# Patient Record
Sex: Female | Born: 1955 | Race: White | Hispanic: No | Marital: Married | Smoking: Former smoker
Health system: Southern US, Community
[De-identification: ages and names within clinical notes are randomized; demographics above are authoritative.]

---

## 1996-12-07 DIAGNOSIS — C679 Malignant neoplasm of bladder, unspecified: Secondary | ICD-10-CM

## 1996-12-07 HISTORY — DX: Malignant neoplasm of bladder, unspecified: C67.9

## 2019-07-28 ENCOUNTER — Other Ambulatory Visit: Payer: Self-pay | Admitting: Internal Medicine

## 2019-07-28 DIAGNOSIS — Z1231 Encounter for screening mammogram for malignant neoplasm of breast: Secondary | ICD-10-CM

## 2019-09-01 ENCOUNTER — Ambulatory Visit
Admission: RE | Admit: 2019-09-01 | Discharge: 2019-09-01 | Disposition: A | Discharge status: Home / Self Care | Payer: PRIVATE HEALTH INSURANCE | Source: Ambulatory Visit | Attending: Internal Medicine | Admitting: Internal Medicine

## 2019-09-01 ENCOUNTER — Encounter: Payer: Self-pay | Admitting: Radiology

## 2019-09-01 DIAGNOSIS — Z1231 Encounter for screening mammogram for malignant neoplasm of breast: Secondary | ICD-10-CM | POA: Diagnosis present

## 2020-08-06 ENCOUNTER — Other Ambulatory Visit: Payer: Self-pay | Admitting: Internal Medicine

## 2020-08-06 DIAGNOSIS — Z1231 Encounter for screening mammogram for malignant neoplasm of breast: Secondary | ICD-10-CM

## 2020-09-06 ENCOUNTER — Other Ambulatory Visit: Payer: Self-pay

## 2020-09-06 ENCOUNTER — Ambulatory Visit
Admission: RE | Admit: 2020-09-06 | Discharge: 2020-09-06 | Disposition: A | Discharge status: Home / Self Care | Payer: PRIVATE HEALTH INSURANCE | Source: Ambulatory Visit | Attending: Internal Medicine | Admitting: Internal Medicine

## 2020-09-06 DIAGNOSIS — Z1231 Encounter for screening mammogram for malignant neoplasm of breast: Secondary | ICD-10-CM | POA: Insufficient documentation

## 2021-08-06 ENCOUNTER — Other Ambulatory Visit: Payer: Self-pay | Admitting: Internal Medicine

## 2021-08-06 DIAGNOSIS — Z1231 Encounter for screening mammogram for malignant neoplasm of breast: Secondary | ICD-10-CM

## 2021-09-11 ENCOUNTER — Other Ambulatory Visit: Payer: Self-pay

## 2021-09-11 ENCOUNTER — Ambulatory Visit
Admission: RE | Admit: 2021-09-11 | Discharge: 2021-09-11 | Disposition: A | Discharge status: Home / Self Care | Payer: Medicare Other | Source: Ambulatory Visit | Attending: Internal Medicine | Admitting: Internal Medicine

## 2021-09-11 DIAGNOSIS — Z1231 Encounter for screening mammogram for malignant neoplasm of breast: Secondary | ICD-10-CM | POA: Diagnosis not present

## 2022-04-08 IMAGING — MG MM DIGITAL SCREENING BILAT W/ TOMO AND CAD
6 of 10 series · 6 of 30 positions shown · non-contrast
Comparison: Previous exam(s).

CLINICAL DATA: Screening.

EXAM:
DIGITAL SCREENING BILATERAL MAMMOGRAM WITH TOMOSYNTHESIS AND CAD
TECHNIQUE: Bilateral screening digital craniocaudal and mediolateral oblique
mammograms were obtained. Bilateral screening digital breast
tomosynthesis was performed. The images were evaluated with
computer-aided detection.

[L MLO synth-2D (1 of 2)]
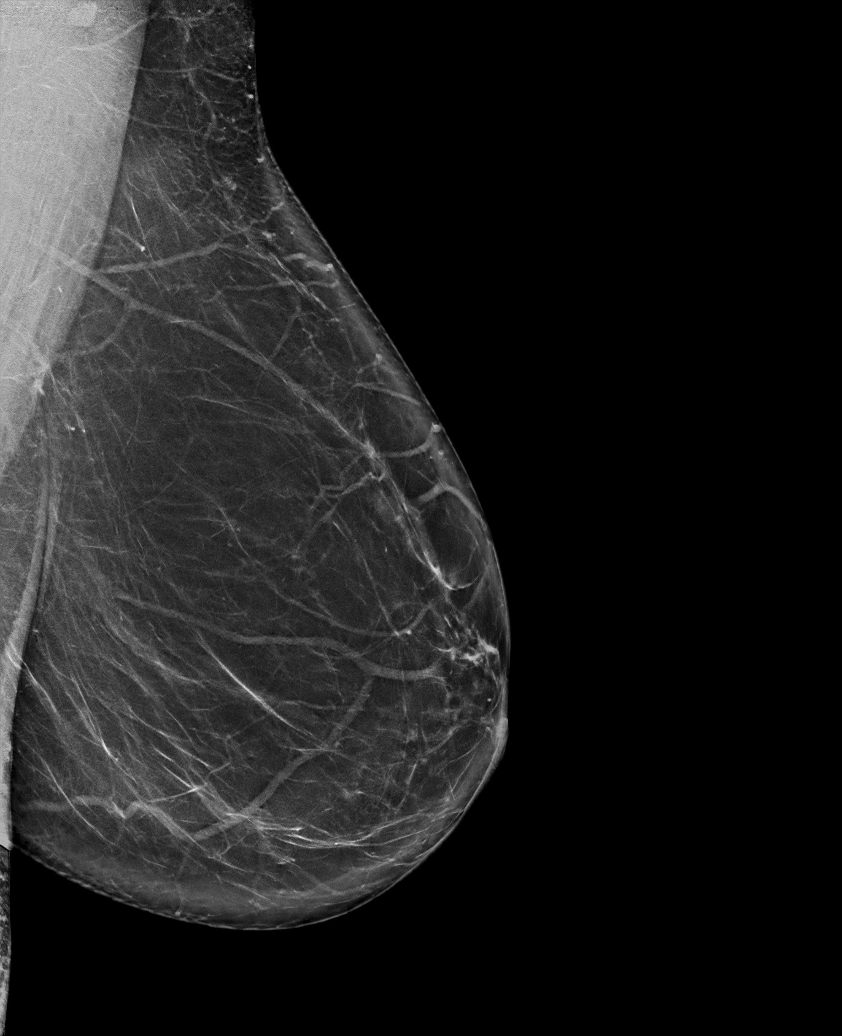

[R MLO synth-2D]
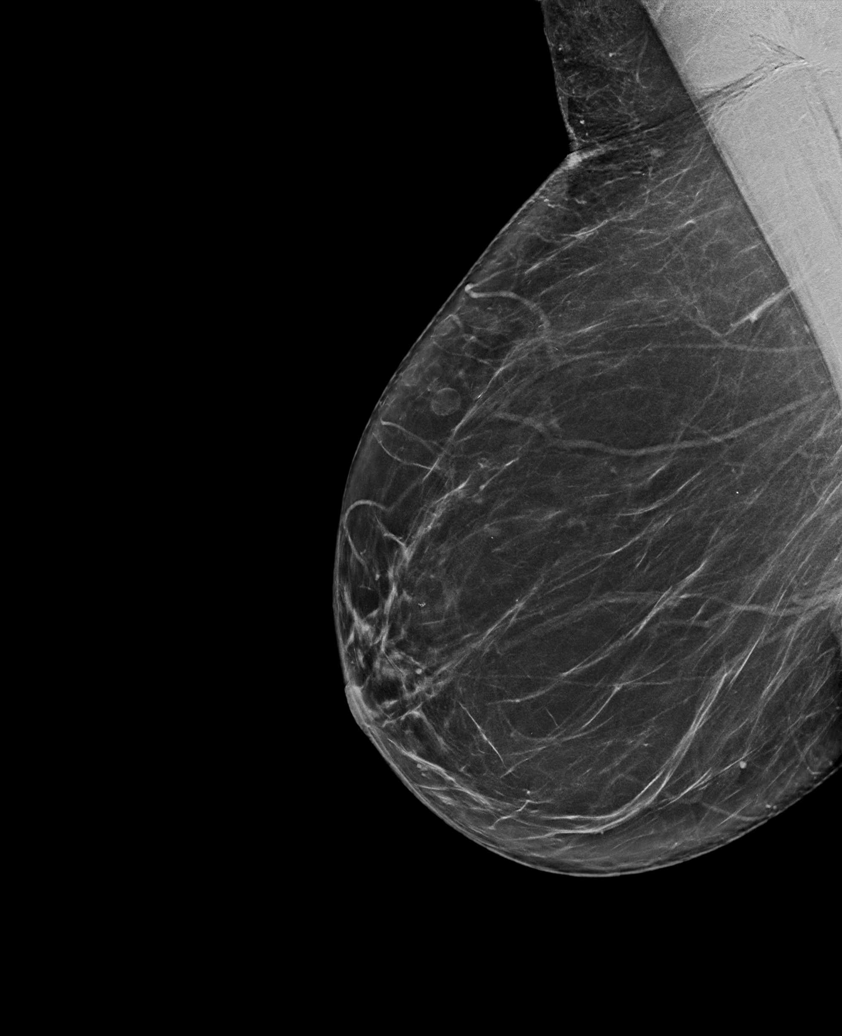

[L MLO synth-2D (2 of 2)]
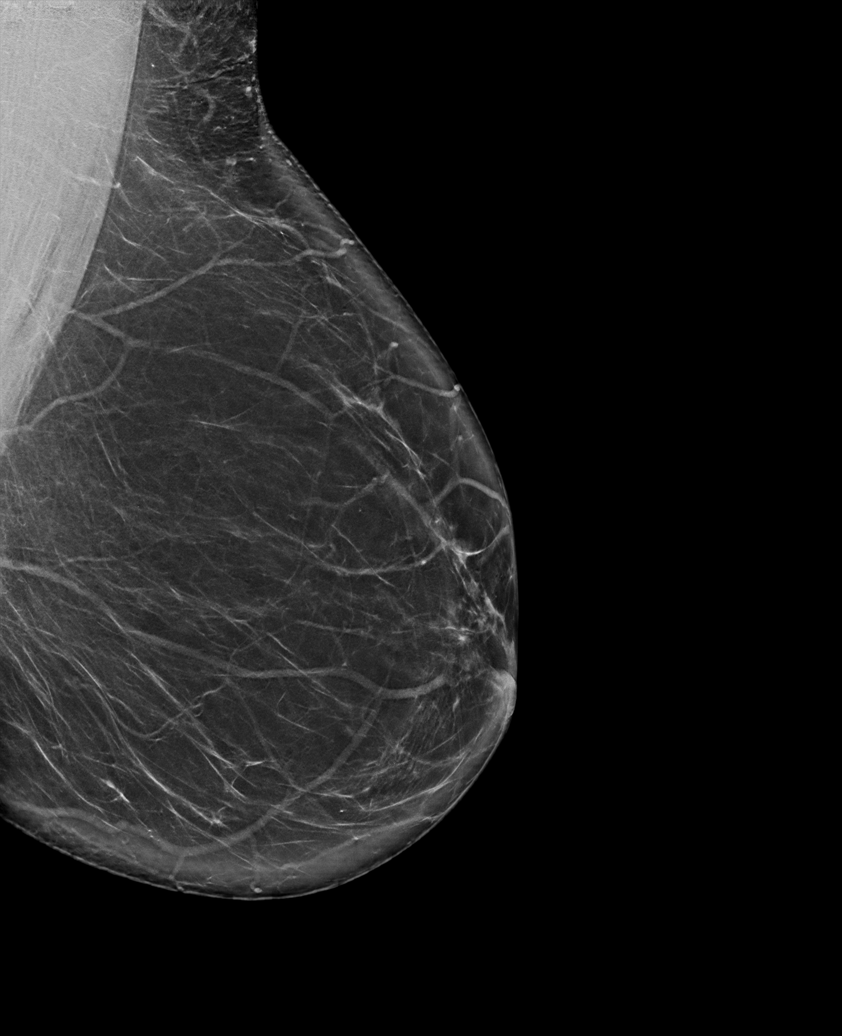

[R CC synth-2D]
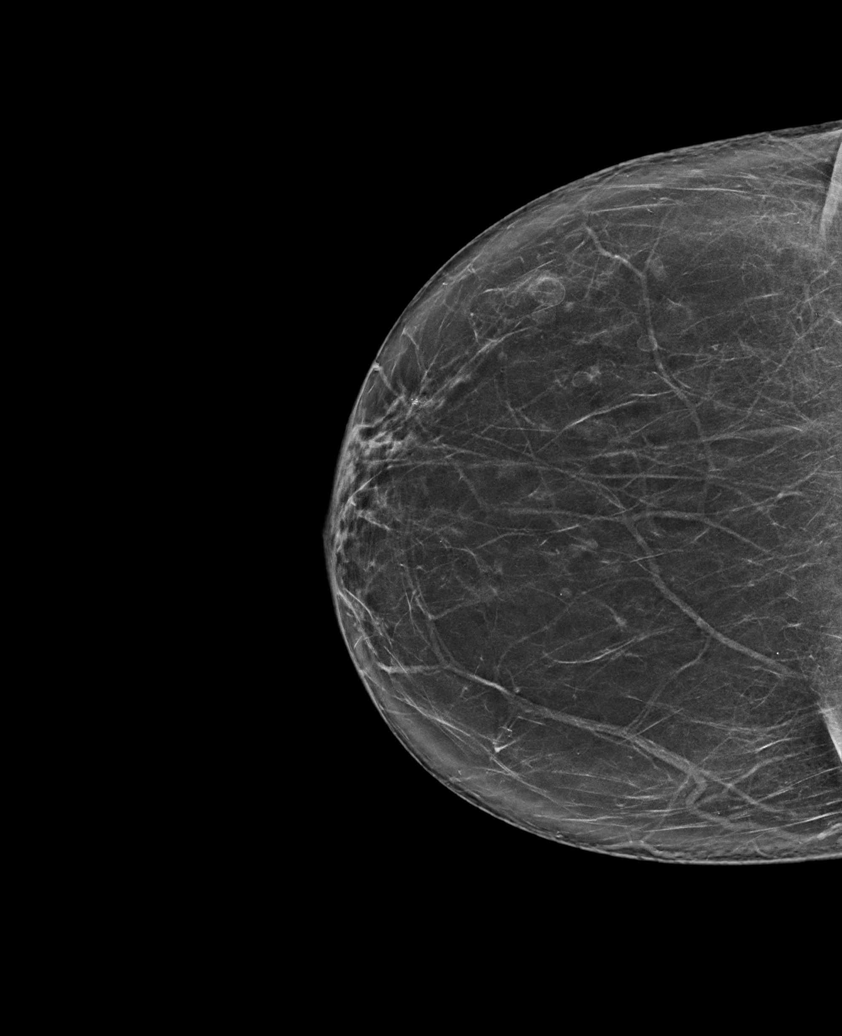

[L CC synth-2D]
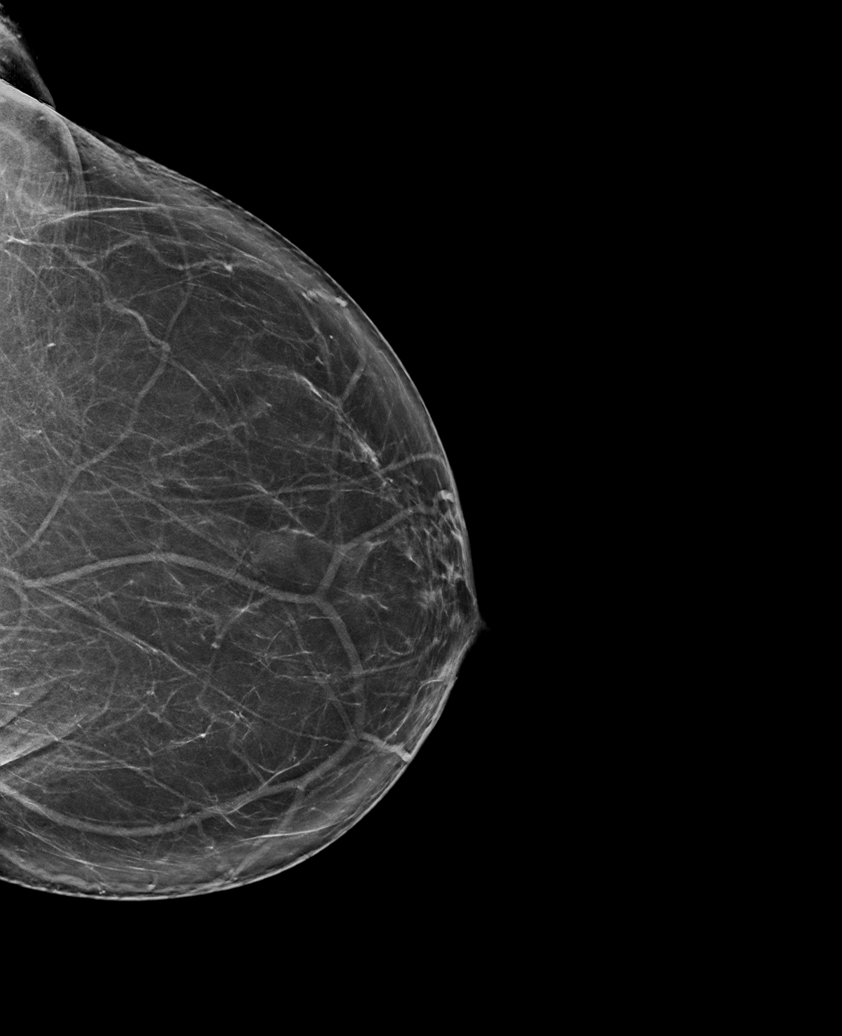

[L CC tomo · tomo slice 39/76.0]
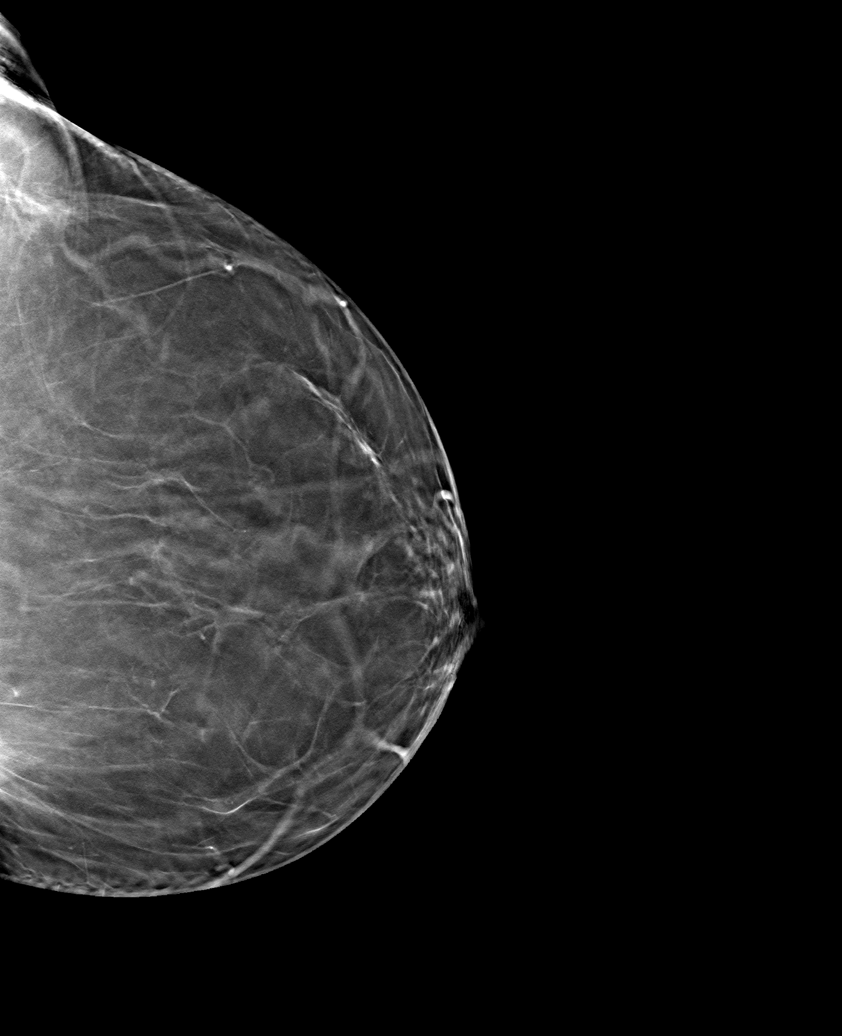

[6 of 30 positions shown; findings below may reference images not displayed]

ACR Breast Density Category b: There are scattered areas of
fibroglandular density.
FINDINGS: There are no findings suspicious for malignancy.
IMPRESSION: No mammographic evidence of malignancy. A result letter of this
screening mammogram will be mailed directly to the patient.

RECOMMENDATION:
Screening mammogram in one year. (Code:51-O-LD2)

BI-RADS CATEGORY  1: Negative.

## 2022-07-03 ENCOUNTER — Other Ambulatory Visit: Payer: Self-pay | Admitting: Internal Medicine

## 2022-07-03 DIAGNOSIS — Z1231 Encounter for screening mammogram for malignant neoplasm of breast: Secondary | ICD-10-CM

## 2022-09-15 ENCOUNTER — Ambulatory Visit
Admission: RE | Admit: 2022-09-15 | Discharge: 2022-09-15 | Disposition: A | Discharge status: Home / Self Care | Payer: Medicare Other | Source: Ambulatory Visit | Attending: Internal Medicine | Admitting: Internal Medicine

## 2022-09-15 DIAGNOSIS — Z1231 Encounter for screening mammogram for malignant neoplasm of breast: Secondary | ICD-10-CM | POA: Diagnosis present

## 2023-02-10 ENCOUNTER — Other Ambulatory Visit (HOSPITAL_BASED_OUTPATIENT_CLINIC_OR_DEPARTMENT_OTHER): Payer: Self-pay | Admitting: Registered Nurse

## 2023-02-10 DIAGNOSIS — Z8249 Family history of ischemic heart disease and other diseases of the circulatory system: Secondary | ICD-10-CM

## 2023-02-17 ENCOUNTER — Inpatient Hospital Stay: Admission: RE | Admit: 2023-02-17 | Payer: Medicare Other | Source: Ambulatory Visit

## 2023-02-18 ENCOUNTER — Ambulatory Visit: Payer: Medicare Other

## 2023-02-18 ENCOUNTER — Encounter: Payer: Self-pay | Admitting: Urology

## 2023-02-18 ENCOUNTER — Ambulatory Visit: Payer: Medicare Other | Admitting: Urology

## 2023-02-18 ENCOUNTER — Ambulatory Visit: Admission: RE | Admit: 2023-02-18 | Payer: Medicare Other | Source: Ambulatory Visit

## 2023-02-18 VITALS — BP 147/94 | HR 82 | Ht 66.0 in | Wt 193.0 lb

## 2023-02-18 DIAGNOSIS — Z8551 Personal history of malignant neoplasm of bladder: Secondary | ICD-10-CM

## 2023-02-18 LAB — URINALYSIS, COMPLETE
Bilirubin, UA: NEGATIVE
Glucose, UA: NEGATIVE
Ketones, UA: NEGATIVE
Nitrite, UA: POSITIVE — AB
Specific Gravity, UA: 1.015 (ref 1.005–1.030)
Urobilinogen, Ur: 0.2 mg/dL (ref 0.2–1.0)
pH, UA: 5.5 (ref 5.0–7.5)

## 2023-02-18 LAB — MICROSCOPIC EXAMINATION: WBC, UA: >30 /hpf — AB (ref 0–5)

## 2023-02-18 NOTE — Progress Notes (Signed)
   02/18/2023 10:03 AM   Colleen Key 1956-02-26 [id number]  Referring provider: [facility], [place] [facility] [place],  [place] [address]  Chief Complaint  Patient presents with   Other    HPI: Colleen Key is a 67 y.o. female referred for history bladder cancer.  Radical cystectomy with continent diversion by Dr. Esperanza Heir in [place] in 2006 She had no postoperative problems and it has been at least 10+ years since she had urologic follow-up She catheterizes per urethra.  No history of UTIs or pyelonephritis Denies flank, abdominal, pelvic pain No gross hematuria Creatinine 10/26/2022 was 0.8; LFTs normal   PMH: Past Medical History:  Diagnosis Date   Bladder cancer ([place]) 1998    Surgical History: No past surgical history on file.  Home Medications:  Allergies as of 02/18/2023       Reactions   Sulfa Antibiotics Swelling        Medication List        Accurate as of February 18, 2023 10:03 AM. If you have any questions, ask your nurse or doctor.          buPROPion 150 MG 24 hr tablet Commonly known as: WELLBUTRIN XL 1 tablet in the morning Orally Once a day   cetirizine 10 MG tablet Commonly known as: ZYRTEC 1 tablet Orally Once a day   Cholecalciferol 50 MCG (2000 UT) Tabs Take by mouth.   escitalopram 20 MG tablet Commonly known as: LEXAPRO 1 tablet Orally Once a day   Magnesium 250 MG Tabs 1 tablet with a meal Orally Once a day        Allergies:  Allergies  Allergen Reactions   Sulfa Antibiotics Swelling    Family History: Family History  Problem Relation Age of Onset   Breast cancer Neg Hx     Social History:  has no history on file for tobacco use, alcohol use, and drug use.   Physical Exam: BP (!) 147/94   Pulse 82   Ht 5\' 6"  (1.676 m)   Wt 193 lb (87.5 kg)   BMI 31.15 kg/m   Constitutional:  Alert and oriented, No acute distress. HEENT: [place] AT Respiratory: Normal respiratory effort, no  increased work of breathing. Psychiatric: Normal mood and affect.  Laboratory Data:  Urinalysis Dipstick trace blood/nitrite positive/1+ leukocytes Microscopy >30 WBC/3-10 RBC/many bacteria   Assessment & Plan:    1.  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

## 2023-02-23 LAB — CULTURE, URINE COMPREHENSIVE

## 2023-02-25 ENCOUNTER — Ambulatory Visit
Admission: RE | Admit: 2023-02-25 | Discharge: 2023-02-25 | Disposition: A | Discharge status: Home / Self Care | Payer: Medicare Other | Source: Ambulatory Visit | Attending: Registered Nurse | Admitting: Registered Nurse

## 2023-02-25 DIAGNOSIS — Z8249 Family history of ischemic heart disease and other diseases of the circulatory system: Secondary | ICD-10-CM

## 2023-03-17 ENCOUNTER — Ambulatory Visit: Payer: Medicare Other | Attending: Internal Medicine | Admitting: Internal Medicine

## 2023-03-17 VITALS — BP 138/80 | HR 61 | Ht 66.0 in | Wt 193.4 lb

## 2023-03-17 DIAGNOSIS — R931 Abnormal findings on diagnostic imaging of heart and coronary circulation: Secondary | ICD-10-CM

## 2023-03-17 NOTE — Progress Notes (Signed)
Cardiology Office Note:    Date:  03/17/2023   ID:  Colleen Key, DOB August 08, 1956, MRN [id number]  PCP:  Fatima Sanger, FNP   [facility] Providers Cardiologist:  Maisie Fus, MD     Referring MD: Fatima Sanger, FNP   No chief complaint on file. Elevated CAC  History of Present Illness:    Colleen Key is a 67 y.o. female with a hx of bladder CA with radical cystectomy (she self caths), TAH BSO, former smoker quit 2000, referral from [facility] for elevated CAC score  She is doing well today. She started crestor and aspirin already. No issues. She likes to walk. 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 No CP with activity or SOB. No hx of cardiac stress test. No imaging. She smoked since age 82 to year 67. No claudication.  02/03/2023 TC 218 HDL 52 LDL 147 Crt 0.78  CAC 03/01/2023 Agaston score 199; 87th percentile  Past Medical History:  Diagnosis Date   Bladder cancer (HCC) 1998    No past surgical history on file.  Current Medications: Current Outpatient Medications on File Prior to Visit  Medication Sig Dispense Refill   aspirin EC 81 MG tablet Take 81 mg by mouth daily. Swallow whole.     buPROPion (WELLBUTRIN XL) 150 MG 24 hr tablet 1 tablet in the morning Orally Once a day     cetirizine (ZYRTEC) 10 MG tablet 1 tablet Orally Once a day     Cholecalciferol 50 MCG (2000 UT) TABS Take by mouth.     escitalopram (LEXAPRO) 20 MG tablet 1 tablet Orally Once a day     Magnesium 250 MG TABS 1 tablet with a meal Orally Once a day     rosuvastatin (CRESTOR) 5 MG tablet Take 5 mg by mouth daily.     No current facility-administered medications on file prior to visit.     Allergies:   Sulfa antibiotics   Social History   Socioeconomic History   Marital status: Unknown    Spouse name: Not on file   Number of children: Not on file   Years of education: Not on file   Highest education  level: Not on file  Occupational History   Not on file  Tobacco Use   Smoking status: Not on file   Smokeless tobacco: Not on file  Substance and Sexual Activity   Alcohol use: Not on file   Drug use: Not on file   Sexual activity: Not on file  Other Topics Concern   Not on file  Social History Narrative   Not on file   Social Determinants of Health   Financial Resource Strain: Not on file  Food Insecurity: Not on file  Transportation Needs: Not on file  Physical Activity: Not on file  Stress: Not on file  Social Connections: Not on file     Family History: The patient's family history is negative for Breast cancer. Mother had MI at 1.   ROS:   Please see the history of present illness.     All other systems reviewed and are negative.  EKGs/Labs/Other Studies Reviewed:    The following studies were reviewed today:   EKG:  EKG is  ordered today.  The ekg ordered today demonstrates   03/17/2023- NSR  Recent Labs: No results found for requested labs within last 365 days.   Recent Lipid Panel No results  found for: "CHOL", "TRIG", "HDL", "CHOLHDL", "VLDL", "LDLCALC", "LDLDIRECT"   Risk Assessment/Calculations:         Physical Exam:    VS:   Vitals:   03/17/23 1103  BP: 138/80  Pulse: 61  SpO2: 97%    Wt Readings from Last 3 Encounters:  03/17/23 193 lb 6.4 oz (87.7 kg)  02/18/23 193 lb (87.5 kg)     GEN:  Well nourished, well developed in no acute distress HEENT: Normal NECK: No JVD; No carotid bruits LYMPHATICS: No lymphadenopathy CARDIAC: RRR, no murmurs, rubs, gallops RESPIRATORY:  Clear to auscultation without rales, wheezing or rhonchi  ABDOMEN: Soft, non-tender, non-distended MUSCULOSKELETAL:  No edema; No deformity  SKIN: Warm and dry NEUROLOGIC:  Alert and oriented x 3 PSYCHIATRIC:  Normal affect   ASSESSMENT:    Elevated CAC: Elevated CAC: > 75th percentile.  His CAC score is elevated. Recommend statin therapy and asa 81 mg daily.  LDL goal < 70 mg/dL. Recommend continued lifestyle modifications including healthy diet, lipid management, DM2 screening, and exercise. Blood pressure is in good control - continue crestor 5 mg daily; will FU lipids - continue asa 81 mg daily  PLAN:    In order of problems listed above:  Fasting Lipids in 2 months Follow up 6 months     Medication Adjustments/Labs and Tests Ordered: Current medicines are reviewed at length with the patient today.  Concerns regarding medicines are outlined above.  Orders Placed This Encounter  Procedures   Lipid panel   EKG 12-Lead   No orders of the defined types were placed in this encounter.   Patient Instructions  Medication Instructions:  No Changes In Medications at this time.  *If you need a refill on your cardiac medications before your next appointment, please call your pharmacy*  Lab Work:  Please return for FASTING Blood Work in 2 MONTHS. No appointment needed, lab here at the office is open Monday-Friday from 8AM to 4PM and closed daily for lunch from 12:45-1:45.   If you have labs (blood work) drawn today and your tests are completely normal, you will receive your results only by: MyChart Message (if you have MyChart) OR A paper copy in the mail If you have any lab test that is abnormal or we need to change your treatment, we will call you to review the results.  Testing/Procedures: None Ordered At This Time.   Follow-Up: At [facility], you and your health needs are our priority.  As part of our continuing mission to provide you with exceptional heart care, we have created designated Provider Care Teams.  These Care Teams include your primary Cardiologist (physician) and Advanced Practice Providers (APPs -  Physician Assistants and Nurse Practitioners) who all work together to provide you with the care you need, when you need it.  Your next appointment:   6 month(s)  Provider:   Maisie Fus, MD       Signed, Maisie Fus, MD  03/17/2023 3:43 PM    [facility]

## 2023-03-17 NOTE — Patient Instructions (Signed)
Medication Instructions:  No Changes In Medications at this time.  *If you need a refill on your cardiac medications before your next appointment, please call your pharmacy*  Lab Work:  Please return for FASTING Blood Work in 2 MONTHS. No appointment needed, lab here at the office is open Monday-Friday from 8AM to 4PM and closed daily for lunch from 12:45-1:45.   If you have labs (blood work) drawn today and your tests are completely normal, you will receive your results only by: MyChart Message (if you have MyChart) OR A paper copy in the mail If you have any lab test that is abnormal or we need to change your treatment, we will call you to review the results.  Testing/Procedures: None Ordered At This Time.   Follow-Up: At [facility], you and your health needs are our priority.  As part of our continuing mission to provide you with exceptional heart care, we have created designated Provider Care Teams.  These Care Teams include your primary Cardiologist (physician) and Advanced Practice Providers (APPs -  Physician Assistants and Nurse Practitioners) who all work together to provide you with the care you need, when you need it.  Your next appointment:   6 month(s)  Provider:   Maisie Fus, MD

## 2023-04-29 ENCOUNTER — Other Ambulatory Visit: Payer: Self-pay

## 2023-04-29 DIAGNOSIS — R931 Abnormal findings on diagnostic imaging of heart and coronary circulation: Secondary | ICD-10-CM

## 2023-04-30 ENCOUNTER — Telehealth: Payer: Self-pay | Admitting: Internal Medicine

## 2023-04-30 LAB — LIPID PANEL
Chol/HDL Ratio: 2.4 ratio (ref 0.0–4.4)
Cholesterol, Total: 134 mg/dL (ref 100–199)
HDL: 57 mg/dL (ref >39)
LDL Chol Calc (NIH): 61 mg/dL (ref 0–99)
Triglycerides: 85 mg/dL (ref 0–149)
VLDL Cholesterol Cal: 16 mg/dL (ref 5–40)

## 2023-04-30 NOTE — Telephone Encounter (Signed)
Lipid panel not reviewed yet by Dr. Wyline Mood- will send once reviewed.

## 2023-04-30 NOTE — Telephone Encounter (Signed)
Patient wants copy of recent lab results mailed to her at [address], [facility] [place] [address].

## 2023-05-04 NOTE — Telephone Encounter (Signed)
Letter of results printed and mailed to patient.

## 2023-08-13 ENCOUNTER — Other Ambulatory Visit: Payer: Self-pay | Admitting: Registered Nurse

## 2023-08-13 DIAGNOSIS — Z1231 Encounter for screening mammogram for malignant neoplasm of breast: Secondary | ICD-10-CM

## 2023-09-09 ENCOUNTER — Encounter: Payer: Self-pay | Admitting: Internal Medicine

## 2023-09-09 ENCOUNTER — Ambulatory Visit: Payer: Medicare Other | Attending: Internal Medicine | Admitting: Internal Medicine

## 2023-09-09 VITALS — BP 132/80 | HR 67 | Ht 66.0 in | Wt 190.8 lb

## 2023-09-09 DIAGNOSIS — R931 Abnormal findings on diagnostic imaging of heart and coronary circulation: Secondary | ICD-10-CM

## 2023-09-09 NOTE — Progress Notes (Signed)
Cardiology Office Note:    Date:  09/09/2023   ID:  Colleen Key, DOB 1956/03/01, MRN [id number]  PCP:  Fatima Sanger, FNP   [facility] Providers Cardiologist:  Maisie Fus, MD     Referring MD: Fatima Sanger, FNP   No chief complaint on file. Elevated CAC  History of Present Illness:    Colleen Key is a 67 y.o. female with a hx of bladder CA with radical cystectomy (she self caths), TAH BSO, former smoker quit 2000, referral from [facility] for elevated CAC score  She is doing well today. She started crestor and aspirin already. No issues. She likes to walk. 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 No CP with activity or SOB. No hx of cardiac stress test. No imaging. She smoked since age 89 to year 25. No claudication.  02/03/2023 TC 218 HDL 52 LDL 147 Crt 0.78  CAC 03/01/2023 Agaston score 199; 87th percentile  Interval hx 09/09/2023 She denies CP or SOB. She is doing well.  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 This is stressful  Past Medical History:  Diagnosis Date   Bladder cancer (HCC) 1998    No past surgical history on file.  Current Medications: Current Outpatient Medications on File Prior to Visit  Medication Sig Dispense Refill   aspirin EC 81 MG tablet Take 81 mg by mouth daily. Swallow whole.     buPROPion (WELLBUTRIN XL) 150 MG 24 hr tablet 1 tablet in the morning Orally Once a day     cetirizine (ZYRTEC) 10 MG tablet 1 tablet Orally Once a day     Cholecalciferol 50 MCG (2000 UT) TABS Take by mouth.     escitalopram (LEXAPRO) 20 MG tablet 1 tablet Orally Once a day     Magnesium 250 MG TABS 1 tablet with a meal Orally Once a day     rosuvastatin (CRESTOR) 5 MG tablet Take 5 mg by mouth daily.     No current facility-administered medications on file prior to visit.     Allergies:   Sulfa antibiotics   Social History   Socioeconomic History   Marital status:  Unknown    Spouse name: Not on file   Number of children: Not on file   Years of education: Not on file   Highest education level: Not on file  Occupational History   Not on file  Tobacco Use   Smoking status: Not on file   Smokeless tobacco: Not on file  Substance and Sexual Activity   Alcohol use: Not on file   Drug use: Not on file   Sexual activity: Not on file  Other Topics Concern   Not on file  Social History Narrative   Not on file   Social Determinants of Health   Financial Resource Strain: Not on file  Food Insecurity: Not on file  Transportation Needs: Not on file  Physical Activity: Not on file  Stress: Not on file  Social Connections: Not on file     Family History: The patient's family history is negative for Breast cancer. Mother had MI at 58.   ROS:   Please see the history of present illness.     All other systems reviewed and are negative.  EKGs/Labs/Other Studies Reviewed:    The following studies were reviewed today:   EKG:  EKG is  ordered today.  The ekg ordered today demonstrates  03/17/2023- NSR  EKG Interpretation Date/Time:  Thursday September 09 2023 10:12:01 EDT Ventricular Rate:  67 PR Interval:  174 QRS Duration:  76 QT Interval:  396 QTC Calculation: 418 R Axis:   7  Text Interpretation: Normal sinus rhythm Normal ECG No previous ECGs available Confirmed by Carolan Clines ([id number]) on 09/09/2023 10:44:49 AM   Recent Labs: No results found for requested labs within last 365 days.   Recent Lipid Panel    Component Value Date/Time   CHOL 134 04/29/2023 0929   TRIG 85 04/29/2023 0929   HDL 57 04/29/2023 0929   CHOLHDL 2.4 04/29/2023 0929   LDLCALC 61 04/29/2023 0929     Risk Assessment/Calculations:         Physical Exam:    Vitals:   09/09/23 1006  BP: 132/80  Pulse: 67  SpO2: 95%     Wt Readings from Last 3 Encounters:  03/17/23 193 lb 6.4 oz (87.7 kg)  02/18/23 193 lb (87.5 kg)     GEN:  Well nourished,  well developed in no acute distress HEENT: Normal NECK: No JVD; No carotid bruits LYMPHATICS: No lymphadenopathy CARDIAC: RRR, no murmurs, rubs, gallops RESPIRATORY:  Clear to auscultation without rales, wheezing or rhonchi  ABDOMEN: Soft, non-tender, non-distended MUSCULOSKELETAL:  No edema; No deformity  SKIN: Warm and dry NEUROLOGIC:  Alert and oriented x 3 PSYCHIATRIC:  Normal affect   ASSESSMENT:    Elevated CAC: Elevated CAC: > 75th percentile.  His CAC score is elevated. Recommend statin therapy and asa 81 mg daily. LDL goal < 70 mg/dL. Recommend continued lifestyle modifications including healthy diet, lipid management, DM2 screening, and exercise. Blood pressure is in good control - continue crestor 5 mg daily, LDL is at goal 61 mg/dL - continue asa 81 mg daily  PLAN:    In order of problems listed above:   Follow up 6 months     Medication Adjustments/Labs and Tests Ordered: Current medicines are reviewed at length with the patient today.  Concerns regarding medicines are outlined above.  No orders of the defined types were placed in this encounter.  No orders of the defined types were placed in this encounter.   There are no Patient Instructions on file for this visit.   Signed, Maisie Fus, MD  09/09/2023 9:03 AM    [facility]

## 2023-09-09 NOTE — Patient Instructions (Signed)
    Follow-Up: At [facility], you and your health needs are our priority.  As part of our continuing mission to provide you with exceptional heart care, we have created designated Provider Care Teams.  These Care Teams include your primary Cardiologist (physician) and Advanced Practice Providers (APPs -  Physician Assistants and Nurse Practitioners) who all work together to provide you with the care you need, when you need it.  We recommend signing up for the patient portal called "MyChart".  Sign up information is provided on this After Visit Summary.  MyChart is used to connect with patients for Virtual Visits (Telemedicine).  Patients are able to view lab/test results, encounter notes, upcoming appointments, etc.  Non-urgent messages can be sent to your provider as well.   To learn more about what you can do with MyChart, go to [email or web address].    Your next appointment:   6 month(s)  The format for your next appointment:   In Person  Provider:   Maisie Fus, MD

## 2023-09-23 ENCOUNTER — Ambulatory Visit
Admission: RE | Admit: 2023-09-23 | Discharge: 2023-09-23 | Disposition: A | Discharge status: Home / Self Care | Payer: Medicare Other | Source: Ambulatory Visit | Attending: Registered Nurse | Admitting: Registered Nurse

## 2023-09-23 DIAGNOSIS — Z1231 Encounter for screening mammogram for malignant neoplasm of breast: Secondary | ICD-10-CM | POA: Insufficient documentation

## 2023-10-22 LAB — LAB REPORT - SCANNED: EGFR: 84

## 2023-12-16 ENCOUNTER — Ambulatory Visit: Payer: Medicare Other | Admitting: Urology

## 2023-12-16 DIAGNOSIS — H43393 Other vitreous opacities, bilateral: Secondary | ICD-10-CM | POA: Diagnosis not present

## 2023-12-16 DIAGNOSIS — H04123 Dry eye syndrome of bilateral lacrimal glands: Secondary | ICD-10-CM | POA: Diagnosis not present

## 2023-12-16 DIAGNOSIS — H53143 Visual discomfort, bilateral: Secondary | ICD-10-CM | POA: Diagnosis not present

## 2023-12-16 DIAGNOSIS — H50111 Monocular exotropia, right eye: Secondary | ICD-10-CM | POA: Diagnosis not present

## 2023-12-23 ENCOUNTER — Ambulatory Visit: Payer: Medicare Other | Admitting: Urology

## 2023-12-23 ENCOUNTER — Encounter: Payer: Self-pay | Admitting: Urology

## 2023-12-23 VITALS — BP 136/72 | HR 72 | Ht 66.0 in | Wt 193.0 lb

## 2023-12-23 DIAGNOSIS — D225 Melanocytic nevi of trunk: Secondary | ICD-10-CM | POA: Diagnosis not present

## 2023-12-23 DIAGNOSIS — Z8551 Personal history of malignant neoplasm of bladder: Secondary | ICD-10-CM

## 2023-12-23 DIAGNOSIS — R3129 Other microscopic hematuria: Secondary | ICD-10-CM

## 2023-12-23 DIAGNOSIS — D235 Other benign neoplasm of skin of trunk: Secondary | ICD-10-CM | POA: Diagnosis not present

## 2023-12-23 DIAGNOSIS — L820 Inflamed seborrheic keratosis: Secondary | ICD-10-CM | POA: Diagnosis not present

## 2023-12-23 DIAGNOSIS — D485 Neoplasm of uncertain behavior of skin: Secondary | ICD-10-CM | POA: Diagnosis not present

## 2023-12-23 NOTE — Progress Notes (Signed)
I, Maysun Anabel Bene, acting as a scribe for Riki Altes, MD., have documented all relevant documentation on the behalf of Riki Altes, MD, as directed by Riki Altes, MD while in the presence of Riki Altes, MD.  12/23/2023 3:14 PM   Sammuel Hines [birth year] [id number]  Referring provider: Fatima Sanger, FNP [address] [place],  [place] [address]  Chief Complaint  Patient presents with   Other   Urologic history 1. History of muscle invasive urothelial carcinoma of bladder Radical cystectomy with continent diversion 2006.  HPI: Colleen Key is a 68 y.o. female presents for follow-up visit.   Initially seen 02/18/23 and was doing well and annual comprehensive metabolic panel/CBC recommended to be performed annually by a primary care provider and to return here as needed. She has had no problems with catheterization. She has recently began an exercise program and has occasional mild pelvic discomfort. Labs performed 10/31/23 were reviewed. CBC and comprehensive metabolic panel was normal. The UA showed 1+ blood on dipstick and no RBCs on microscopy.    PMH: Past Medical History:  Diagnosis Date   Bladder cancer (HCC) 1998    Home Medications:  Allergies as of 12/23/2023       Reactions   Sulfa Antibiotics Swelling        Medication List        Accurate as of December 23, 2023  3:14 PM. If you have any questions, ask your nurse or doctor.          aspirin EC 81 MG tablet Take 81 mg by mouth daily. Swallow whole.   buPROPion 150 MG 24 hr tablet Commonly known as: WELLBUTRIN XL 1 tablet in the morning Orally Once a day   cetirizine 10 MG tablet Commonly known as: ZYRTEC 1 tablet Orally Once a day   Cholecalciferol 50 MCG (2000 UT) Tabs Take by mouth.   escitalopram 20 MG tablet Commonly known as: LEXAPRO 1 tablet Orally Once a day   Magnesium 250 MG Tabs 1 tablet with a meal Orally Once a day   rosuvastatin 5 MG  tablet Commonly known as: CRESTOR Take 5 mg by mouth daily.        Allergies:  Allergies  Allergen Reactions   Sulfa Antibiotics Swelling    Family History: Family History  Problem Relation Age of Onset   Breast cancer Neg Hx     Social History:  reports that she has quit smoking. Her smoking use included cigarettes. She has never used smokeless tobacco. No history on file for alcohol use and drug use.   Physical Exam: BP 136/72   Pulse 72   Ht 5\' 6"  (1.676 m)   Wt 193 lb (87.5 kg)   BMI 31.15 kg/m   Constitutional:  Alert and oriented, No acute distress. HEENT: [place] AT Respiratory: Normal respiratory effort, no increased work of breathing. Psychiatric: Normal mood and affect.   Assessment & Plan:    We discussed dipstick hematuria is highly unreliable and evaluation for abnormal amounts of blood in the urine is based on the microscopic examination  Since she catheterizes would expect to see small amounts of blood in the urine  She would like to continue annual follow-up here.   I have reviewed the above documentation for accuracy and completeness, and I agree with the above.   Riki Altes, MD  [facility] [address], [address] [place], [place] [address] [phone]

## 2024-01-13 ENCOUNTER — Ambulatory Visit: Payer: Medicare Other | Admitting: Adult Health

## 2024-01-13 ENCOUNTER — Encounter: Payer: Self-pay | Admitting: Adult Health

## 2024-01-13 VITALS — BP 145/86 | HR 70 | Ht 67.0 in | Wt 196.0 lb

## 2024-01-13 DIAGNOSIS — G4700 Insomnia, unspecified: Secondary | ICD-10-CM | POA: Diagnosis not present

## 2024-01-13 DIAGNOSIS — F4310 Post-traumatic stress disorder, unspecified: Secondary | ICD-10-CM | POA: Diagnosis not present

## 2024-01-13 DIAGNOSIS — F331 Major depressive disorder, recurrent, moderate: Secondary | ICD-10-CM

## 2024-01-13 DIAGNOSIS — F410 Panic disorder [episodic paroxysmal anxiety] without agoraphobia: Secondary | ICD-10-CM

## 2024-01-13 MED ORDER — ESCITALOPRAM OXALATE 20 MG PO TABS: 20.0000 mg | ORAL_TABLET | Freq: Every day | ORAL | 2 refills | Status: DC

## 2024-01-13 MED ORDER — ALPRAZOLAM 0.5 MG PO TABS: ORAL_TABLET | ORAL | 2 refills | Status: DC

## 2024-01-13 MED ORDER — BUPROPION HCL ER (XL) 300 MG PO TB24: 300.0000 mg | ORAL_TABLET | Freq: Every day | ORAL | 2 refills | Status: DC

## 2024-01-13 MED ORDER — ARIPIPRAZOLE 2 MG PO TABS: 2.0000 mg | ORAL_TABLET | Freq: Every day | ORAL | 2 refills | Status: DC
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
## 2024-01-27 ENCOUNTER — Ambulatory Visit: Payer: Medicare Other | Admitting: Physician Assistant

## 2024-02-10 ENCOUNTER — Ambulatory Visit: Payer: Medicare Other | Admitting: Orthopedic Surgery

## 2024-02-11 ENCOUNTER — Encounter: Payer: Self-pay | Admitting: Physician Assistant

## 2024-02-11 ENCOUNTER — Ambulatory Visit: Admitting: Physician Assistant

## 2024-02-11 ENCOUNTER — Other Ambulatory Visit (INDEPENDENT_AMBULATORY_CARE_PROVIDER_SITE_OTHER): Payer: Self-pay

## 2024-02-11 DIAGNOSIS — M542 Cervicalgia: Secondary | ICD-10-CM

## 2024-02-11 MED ORDER — METHYLPREDNISOLONE 4 MG PO TBPK: ORAL_TABLET | ORAL | 0 refills | Status: DC
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
## 2024-02-17 ENCOUNTER — Telehealth (INDEPENDENT_AMBULATORY_CARE_PROVIDER_SITE_OTHER): Payer: Medicare Other | Admitting: Adult Health

## 2024-02-17 ENCOUNTER — Encounter: Payer: Self-pay | Admitting: Adult Health

## 2024-02-17 DIAGNOSIS — F4310 Post-traumatic stress disorder, unspecified: Secondary | ICD-10-CM

## 2024-02-17 DIAGNOSIS — F410 Panic disorder [episodic paroxysmal anxiety] without agoraphobia: Secondary | ICD-10-CM

## 2024-02-17 DIAGNOSIS — F331 Major depressive disorder, recurrent, moderate: Secondary | ICD-10-CM | POA: Diagnosis not present

## 2024-02-17 DIAGNOSIS — G4700 Insomnia, unspecified: Secondary | ICD-10-CM | POA: Diagnosis not present

## 2024-02-17 MED ORDER — ARIPIPRAZOLE 2 MG PO TABS: 2.0000 mg | ORAL_TABLET | Freq: Two times a day (BID) | ORAL | 2 refills | Status: DC
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
## 2024-02-24 ENCOUNTER — Ambulatory Visit: Payer: Medicare Other | Attending: Internal Medicine | Admitting: Internal Medicine

## 2024-02-24 ENCOUNTER — Ambulatory Visit: Admitting: Rehabilitative and Restorative Service Providers"

## 2024-02-24 ENCOUNTER — Encounter: Payer: Self-pay | Admitting: Rehabilitative and Restorative Service Providers"

## 2024-02-24 ENCOUNTER — Encounter: Payer: Self-pay | Admitting: Internal Medicine

## 2024-02-24 VITALS — BP 134/72 | HR 78 | Ht 66.0 in | Wt 191.0 lb

## 2024-02-24 DIAGNOSIS — M5412 Radiculopathy, cervical region: Secondary | ICD-10-CM | POA: Diagnosis not present

## 2024-02-24 DIAGNOSIS — R293 Abnormal posture: Secondary | ICD-10-CM

## 2024-02-24 DIAGNOSIS — R931 Abnormal findings on diagnostic imaging of heart and coronary circulation: Secondary | ICD-10-CM | POA: Diagnosis not present

## 2024-02-24 DIAGNOSIS — M6281 Muscle weakness (generalized): Secondary | ICD-10-CM | POA: Diagnosis not present

## 2024-02-24 MED ORDER — CHLORTHALIDONE 25 MG PO TABS: 25.0000 mg | ORAL_TABLET | Freq: Every day | ORAL | 3 refills | Status: DC

## 2024-02-24 NOTE — Addendum Note (Signed)
 Addended by: Wende Crease on: 02/24/2024 06:31 PM   Modules accepted: Orders

## 2024-02-24 NOTE — Progress Notes (Signed)
 Cardiology Office Note:    Date:  02/24/2024   ID:  Colleen Key, DOB September 11, 1956, MRN [id number]  PCP:  Fatima Sanger, FNP   [facility] Providers Cardiologist:  Maisie Fus, MD     Referring MD: Fatima Sanger, FNP   No chief complaint on file. Elevated CAC  History of Present Illness:    Colleen Key is a 68 y.o. female with a hx of bladder CA with radical cystectomy (she self caths), TAH BSO, former smoker quit 2000, referral from [facility] for elevated CAC score  She is doing well today. She started crestor and aspirin already. No issues. She likes to walk. 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 No CP with activity or SOB. No hx of cardiac stress test. No imaging. She smoked since age 40 to year 35. No claudication.  02/03/2023 TC 218 HDL 52 LDL 147 Crt 0.78  CAC 03/01/2023 Agaston score 199; 87th percentile  Interval hx 09/09/2023 She denies CP or SOB. She is doing well.  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 This is stressful  Interval hx 02/24/2024 She has no significant CP , SOB.  Notes BP at home have been in the 140s.  Overall she is doing well   Past Medical History:  Diagnosis Date   Bladder cancer (HCC) 1998    No past surgical history on file.  Current Medications: Current Outpatient Medications on File Prior to Visit  Medication Sig Dispense Refill   ALPRAZolam (XANAX) 0.5 MG tablet Take one tablet at bedtime as needed for sleep. 30 tablet 2   ARIPiprazole (ABILIFY) 2 MG tablet Take 1 tablet (2 mg total) by mouth 2 (two) times daily. 60 tablet 2   aspirin EC 81 MG tablet Take 81 mg by mouth daily. Swallow whole.     buPROPion (WELLBUTRIN XL) 300 MG 24 hr tablet Take 1 tablet (300 mg total) by mouth daily. 30 tablet 2   cetirizine (ZYRTEC) 10 MG tablet 1 tablet Orally Once a day     Cholecalciferol 50 MCG (2000 UT) TABS Take by mouth.     escitalopram (LEXAPRO) 20 MG  tablet Take 1 tablet (20 mg total) by mouth daily. 30 tablet 2   Magnesium 250 MG TABS 1 tablet with a meal Orally Once a day     methylPREDNISolone (MEDROL DOSEPAK) 4 MG TBPK tablet Take as directed with food 21 tablet 0   rosuvastatin (CRESTOR) 5 MG tablet Take 5 mg by mouth daily.     No current facility-administered medications on file prior to visit.     Allergies:   Sulfa antibiotics   Social History   Socioeconomic History   Marital status: Unknown    Spouse name: Not on file   Number of children: Not on file   Years of education: Not on file   Highest education level: Not on file  Occupational History   Not on file  Tobacco Use   Smoking status: Former    Types: Cigarettes   Smokeless tobacco: Never  Substance and Sexual Activity   Alcohol use: Not on file   Drug use: Not on file   Sexual activity: Not on file  Other Topics Concern   Not on file  Social History Narrative   Not on file   Social Drivers of Health   Financial Resource Strain: Not on file  Food Insecurity: Not on file  Transportation  Needs: Not on file  Physical Activity: Not on file  Stress: Not on file  Social Connections: Not on file     Family History: The patient's family history is negative for Breast cancer. Mother had MI at 18.   ROS:   Please see the history of present illness.     All other systems reviewed and are negative.  EKGs/Labs/Other Studies Reviewed:    The following studies were reviewed today:   EKG:  EKG is  ordered today.  The ekg ordered today demonstrates   03/17/2023- NSR     Recent Labs: No results found for requested labs within last 365 days.   Recent Lipid Panel    Component Value Date/Time   CHOL 134 04/29/2023 0929   TRIG 85 04/29/2023 0929   HDL 57 04/29/2023 0929   CHOLHDL 2.4 04/29/2023 0929   LDLCALC 61 04/29/2023 0929     Risk Assessment/Calculations:         Physical Exam:    Vitals:   02/24/24 1310  BP: 134/72  Pulse: 78   SpO2: 95%     Wt Readings from Last 3 Encounters:  02/24/24 191 lb (86.6 kg)  12/23/23 193 lb (87.5 kg)  09/09/23 190 lb 12.8 oz (86.5 kg)     GEN:  Well nourished, well developed in no acute distress HEENT: Normal NECK: No JVD; No carotid bruits LYMPHATICS: No lymphadenopathy CARDIAC: RRR, no murmurs, rubs, gallops RESPIRATORY:  Clear to auscultation without rales, wheezing or rhonchi  ABDOMEN: Soft, non-tender, non-distended MUSCULOSKELETAL:  No edema; No deformity  SKIN: Warm and dry NEUROLOGIC:  Alert and oriented x 3 PSYCHIATRIC:  Normal affect   ASSESSMENT:    Elevated CAC: Elevated CAC: > 75th percentile.  His CAC score is elevated. Recommend statin therapy and asa 81 mg daily. LDL goal < 70 mg/dL. Recommend continued lifestyle modifications including healthy diet, lipid management, DM2 screening, and exercise. Blood pressure is in good control - continue crestor 5 mg daily, LDL is at goal 61 mg/dL - continue asa 81 mg daily  HTN Bp not quite at goal at home -start chlorthalidone 25 mg daily   PLAN:    In order of problems listed above:   Start chlorthalidone 25 mg daily Follow up 6 months Dr. Servando Salina      Medication Adjustments/Labs and Tests Ordered: Current medicines are reviewed at length with the patient today.  Concerns regarding medicines are outlined above.  No orders of the defined types were placed in this encounter.  No orders of the defined types were placed in this encounter.   There are no Patient Instructions on file for this visit.   Signed, Maisie Fus, MD  02/24/2024 1:28 PM    [facility]

## 2024-02-24 NOTE — Patient Instructions (Signed)
 Medication Instructions:  START Chlorthalidone (HYGROTON) 25 mg daily  *If you need a refill on your cardiac medications before your next appointment, please call your pharmacy*   Follow-Up: At [facility], you and your health needs are our priority.  As part of our continuing mission to provide you with exceptional heart care, we have created designated Provider Care Teams.  These Care Teams include your primary Cardiologist (physician) and Advanced Practice Providers (APPs -  Physician Assistants and Nurse Practitioners) who all work together to provide you with the care you need, when you need it.   Your next appointment:   6 month(s)  Provider:   Dr. Servando Salina  Other Instructions
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
## 2024-03-02 ENCOUNTER — Ambulatory Visit: Admitting: Rehabilitative and Restorative Service Providers"

## 2024-03-02 ENCOUNTER — Encounter: Payer: Self-pay | Admitting: Rehabilitative and Restorative Service Providers"

## 2024-03-02 ENCOUNTER — Ambulatory Visit: Payer: Medicare Other | Admitting: Psychiatry

## 2024-03-02 DIAGNOSIS — R293 Abnormal posture: Secondary | ICD-10-CM | POA: Diagnosis not present

## 2024-03-02 DIAGNOSIS — M6281 Muscle weakness (generalized): Secondary | ICD-10-CM | POA: Diagnosis not present

## 2024-03-02 DIAGNOSIS — F331 Major depressive disorder, recurrent, moderate: Secondary | ICD-10-CM | POA: Diagnosis not present

## 2024-03-02 DIAGNOSIS — M5412 Radiculopathy, cervical region: Secondary | ICD-10-CM
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
## 2024-03-09 ENCOUNTER — Ambulatory Visit: Admitting: Physical Therapy

## 2024-03-09 ENCOUNTER — Encounter: Payer: Self-pay | Admitting: Physician Assistant

## 2024-03-09 ENCOUNTER — Encounter: Payer: Self-pay | Admitting: Physical Therapy

## 2024-03-09 ENCOUNTER — Ambulatory Visit: Admitting: Physician Assistant

## 2024-03-09 DIAGNOSIS — M542 Cervicalgia: Secondary | ICD-10-CM | POA: Diagnosis not present

## 2024-03-09 DIAGNOSIS — M6281 Muscle weakness (generalized): Secondary | ICD-10-CM

## 2024-03-09 DIAGNOSIS — R293 Abnormal posture: Secondary | ICD-10-CM | POA: Diagnosis not present

## 2024-03-09 DIAGNOSIS — M5412 Radiculopathy, cervical region: Secondary | ICD-10-CM
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
## 2024-03-09 NOTE — Progress Notes (Signed)
   Office Visit Note   Patient: Colleen Key           Date of Birth: 04/12/1956           MRN: [id number] Visit Date: 03/09/2024              Requested by: Fatima Sanger, FNP [address] [place],  [place] [address] PCP: Fatima Sanger, FNP   Assessment & Plan: Visit Diagnoses:  1. Neck pain     Plan: Patient is a pleasant 68 year old woman who follows up for her neck pain.  She has been working with physical therapy.  She is found the cervical traction especially helpful to her.  She feels about 85 to 90% better At this point I think she should finish physical therapy continue home exercise program follow-up as needed.  If she has return of significant symptoms she should contact me and I will just order an MRI prior to her visit Follow-Up Instructions: Return if symptoms worsen or fail to improve.   Orders:  No orders of the defined types were placed in this encounter.  No orders of the defined types were placed in this encounter.     Procedures: No procedures performed   Clinical Data: No additional findings.   Subjective: No chief complaint on file.   HPI pleasant 68 year old woman following up on neck pain she has been doing physical therapy cervical traction feels like she is doing much better some of the radicular findings in her arm is decreased as well  Review of Systems  All other systems reviewed and are negative.    Objective: Vital Signs: There were no vitals taken for this visit.  Physical Exam Constitutional:      Appearance: Normal appearance.  Pulmonary:     Effort: Pulmonary effort is normal.  Skin:    General: Skin is warm and dry.  Neurological:     General: No focal deficit present.     Mental Status: She is alert and oriented to person, place, and time.  Psychiatric:        Mood and Affect: Mood normal.        Behavior: Behavior normal.     Ortho Exam Movement of neck is much better without any  hesitation or spasm.  Strength is intact Specialty Comments:  No specialty comments available.  Imaging: No results found.   PMFS History: Patient Active Problem List   Diagnosis Date Noted   Neck pain 03/09/2024   Past Medical History:  Diagnosis Date   Bladder cancer (HCC) 1998    Family History  Problem Relation Age of Onset   Breast cancer Neg Hx     History reviewed. No pertinent surgical history. Social History   Occupational History   Not on file  Tobacco Use   Smoking status: Former    Types: Cigarettes   Smokeless tobacco: Never  Substance and Sexual Activity   Alcohol use: Not on file   Drug use: Not on file   Sexual activity: Not on file

## 2024-03-16 ENCOUNTER — Encounter: Payer: Self-pay | Admitting: Adult Health

## 2024-03-16 ENCOUNTER — Ambulatory Visit: Admitting: Physical Therapy

## 2024-03-16 ENCOUNTER — Telehealth (INDEPENDENT_AMBULATORY_CARE_PROVIDER_SITE_OTHER): Admitting: Adult Health

## 2024-03-16 ENCOUNTER — Encounter: Payer: Self-pay | Admitting: Physical Therapy

## 2024-03-16 DIAGNOSIS — F4310 Post-traumatic stress disorder, unspecified: Secondary | ICD-10-CM | POA: Diagnosis not present

## 2024-03-16 DIAGNOSIS — F339 Major depressive disorder, recurrent, unspecified: Secondary | ICD-10-CM

## 2024-03-16 DIAGNOSIS — M6281 Muscle weakness (generalized): Secondary | ICD-10-CM | POA: Diagnosis not present

## 2024-03-16 DIAGNOSIS — F410 Panic disorder [episodic paroxysmal anxiety] without agoraphobia: Secondary | ICD-10-CM | POA: Diagnosis not present

## 2024-03-16 DIAGNOSIS — M5412 Radiculopathy, cervical region: Secondary | ICD-10-CM | POA: Diagnosis not present

## 2024-03-16 DIAGNOSIS — R293 Abnormal posture: Secondary | ICD-10-CM

## 2024-03-16 DIAGNOSIS — F331 Major depressive disorder, recurrent, moderate: Secondary | ICD-10-CM

## 2024-03-16 DIAGNOSIS — G4700 Insomnia, unspecified: Secondary | ICD-10-CM

## 2024-03-16 MED ORDER — ARIPIPRAZOLE 2 MG PO TABS: 2.0000 mg | ORAL_TABLET | Freq: Two times a day (BID) | ORAL | 2 refills | Status: DC
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
## 2024-03-23 ENCOUNTER — Ambulatory Visit: Admitting: Psychiatry

## 2024-03-23 ENCOUNTER — Ambulatory Visit: Admitting: Rehabilitative and Restorative Service Providers"

## 2024-03-23 ENCOUNTER — Encounter: Payer: Self-pay | Admitting: Rehabilitative and Restorative Service Providers"

## 2024-03-23 DIAGNOSIS — F331 Major depressive disorder, recurrent, moderate: Secondary | ICD-10-CM | POA: Diagnosis not present

## 2024-03-23 DIAGNOSIS — M6281 Muscle weakness (generalized): Secondary | ICD-10-CM

## 2024-03-23 DIAGNOSIS — R293 Abnormal posture: Secondary | ICD-10-CM | POA: Diagnosis not present

## 2024-03-23 DIAGNOSIS — M5412 Radiculopathy, cervical region: Secondary | ICD-10-CM | POA: Diagnosis not present
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
## 2024-03-30 ENCOUNTER — Encounter: Admitting: Rehabilitative and Restorative Service Providers"

## 2024-04-06 ENCOUNTER — Ambulatory Visit: Admitting: Psychiatry

## 2024-04-06 DIAGNOSIS — F331 Major depressive disorder, recurrent, moderate: Secondary | ICD-10-CM
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
## 2024-04-16 ENCOUNTER — Other Ambulatory Visit: Payer: Self-pay | Admitting: Adult Health

## 2024-04-16 DIAGNOSIS — F4310 Post-traumatic stress disorder, unspecified: Secondary | ICD-10-CM

## 2024-04-20 ENCOUNTER — Ambulatory Visit: Admitting: Psychiatry

## 2024-04-20 DIAGNOSIS — E782 Mixed hyperlipidemia: Secondary | ICD-10-CM | POA: Diagnosis not present

## 2024-04-20 DIAGNOSIS — R7309 Other abnormal glucose: Secondary | ICD-10-CM | POA: Diagnosis not present

## 2024-04-20 DIAGNOSIS — F331 Major depressive disorder, recurrent, moderate: Secondary | ICD-10-CM

## 2024-04-20 LAB — LAB REPORT - SCANNED
A1c: 5.6
EGFR (Non-African Amer.): 77
TSH: 2.65 (ref 0.41–5.90)
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
## 2024-04-27 DIAGNOSIS — X32XXXD Exposure to sunlight, subsequent encounter: Secondary | ICD-10-CM | POA: Diagnosis not present

## 2024-04-27 DIAGNOSIS — D225 Melanocytic nevi of trunk: Secondary | ICD-10-CM | POA: Diagnosis not present

## 2024-04-27 DIAGNOSIS — L570 Actinic keratosis: Secondary | ICD-10-CM | POA: Diagnosis not present

## 2024-04-27 DIAGNOSIS — D485 Neoplasm of uncertain behavior of skin: Secondary | ICD-10-CM | POA: Diagnosis not present

## 2024-04-27 DIAGNOSIS — E782 Mixed hyperlipidemia: Secondary | ICD-10-CM | POA: Diagnosis not present

## 2024-04-27 DIAGNOSIS — I2510 Atherosclerotic heart disease of native coronary artery without angina pectoris: Secondary | ICD-10-CM | POA: Diagnosis not present

## 2024-04-27 DIAGNOSIS — K219 Gastro-esophageal reflux disease without esophagitis: Secondary | ICD-10-CM | POA: Diagnosis not present

## 2024-04-27 DIAGNOSIS — R7303 Prediabetes: Secondary | ICD-10-CM | POA: Diagnosis not present

## 2024-04-27 DIAGNOSIS — Z1283 Encounter for screening for malignant neoplasm of skin: Secondary | ICD-10-CM | POA: Diagnosis not present

## 2024-05-04 ENCOUNTER — Encounter: Payer: Self-pay | Admitting: Adult Health

## 2024-05-04 ENCOUNTER — Telehealth: Admitting: Adult Health

## 2024-05-04 DIAGNOSIS — G4700 Insomnia, unspecified: Secondary | ICD-10-CM

## 2024-05-04 DIAGNOSIS — F4310 Post-traumatic stress disorder, unspecified: Secondary | ICD-10-CM | POA: Diagnosis not present

## 2024-05-04 DIAGNOSIS — F410 Panic disorder [episodic paroxysmal anxiety] without agoraphobia: Secondary | ICD-10-CM | POA: Diagnosis not present

## 2024-05-04 DIAGNOSIS — F331 Major depressive disorder, recurrent, moderate: Secondary | ICD-10-CM | POA: Diagnosis not present

## 2024-05-04 MED ORDER — ARIPIPRAZOLE 2 MG PO TABS: 2.0000 mg | ORAL_TABLET | Freq: Every day | ORAL | 1 refills | Status: DC

## 2024-05-04 MED ORDER — BUPROPION HCL ER (XL) 300 MG PO TB24: 300.0000 mg | ORAL_TABLET | Freq: Every day | ORAL | 1 refills | Status: DC

## 2024-05-04 MED ORDER — ALPRAZOLAM 0.5 MG PO TABS: ORAL_TABLET | ORAL | 2 refills | Status: DC

## 2024-05-04 MED ORDER — ESCITALOPRAM OXALATE 20 MG PO TABS: 20.0000 mg | ORAL_TABLET | Freq: Every day | ORAL | 1 refills | Status: DC
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
## 2024-05-11 ENCOUNTER — Ambulatory Visit: Admitting: Psychiatry

## 2024-05-11 DIAGNOSIS — F331 Major depressive disorder, recurrent, moderate: Secondary | ICD-10-CM | POA: Diagnosis not present
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
## 2024-05-13 ENCOUNTER — Other Ambulatory Visit: Payer: Self-pay | Admitting: Adult Health

## 2024-05-13 DIAGNOSIS — F4310 Post-traumatic stress disorder, unspecified: Secondary | ICD-10-CM

## 2024-05-15 DIAGNOSIS — D485 Neoplasm of uncertain behavior of skin: Secondary | ICD-10-CM | POA: Diagnosis not present

## 2024-05-15 DIAGNOSIS — L98429 Non-pressure chronic ulcer of back with unspecified severity: Secondary | ICD-10-CM | POA: Diagnosis not present

## 2024-05-15 DIAGNOSIS — L98499 Non-pressure chronic ulcer of skin of other sites with unspecified severity: Secondary | ICD-10-CM | POA: Diagnosis not present

## 2024-05-25 ENCOUNTER — Ambulatory Visit: Admitting: Psychiatry

## 2024-05-25 DIAGNOSIS — F331 Major depressive disorder, recurrent, moderate: Secondary | ICD-10-CM | POA: Diagnosis not present
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
## 2024-06-08 ENCOUNTER — Ambulatory Visit: Admitting: Psychiatry

## 2024-06-08 DIAGNOSIS — F331 Major depressive disorder, recurrent, moderate: Secondary | ICD-10-CM
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
## 2024-06-22 ENCOUNTER — Ambulatory Visit: Admitting: Psychiatry

## 2024-06-24 ENCOUNTER — Emergency Department (HOSPITAL_COMMUNITY)

## 2024-06-24 ENCOUNTER — Encounter (HOSPITAL_COMMUNITY): Payer: Self-pay

## 2024-06-24 ENCOUNTER — Inpatient Hospital Stay (HOSPITAL_COMMUNITY)
Admission: EM | Admit: 2024-06-24 | Discharge: 2024-06-27 | DRG: 871 | Disposition: A | Discharge status: Home / Self Care | Attending: Internal Medicine | Admitting: Internal Medicine

## 2024-06-24 ENCOUNTER — Other Ambulatory Visit: Payer: Self-pay

## 2024-06-24 DIAGNOSIS — Z87891 Personal history of nicotine dependence: Secondary | ICD-10-CM | POA: Diagnosis not present

## 2024-06-24 DIAGNOSIS — E785 Hyperlipidemia, unspecified: Secondary | ICD-10-CM | POA: Diagnosis not present

## 2024-06-24 DIAGNOSIS — E860 Dehydration: Secondary | ICD-10-CM | POA: Diagnosis present

## 2024-06-24 DIAGNOSIS — K219 Gastro-esophageal reflux disease without esophagitis: Secondary | ICD-10-CM | POA: Diagnosis present

## 2024-06-24 DIAGNOSIS — I10 Essential (primary) hypertension: Secondary | ICD-10-CM | POA: Diagnosis not present

## 2024-06-24 DIAGNOSIS — E871 Hypo-osmolality and hyponatremia: Secondary | ICD-10-CM | POA: Diagnosis not present

## 2024-06-24 DIAGNOSIS — E861 Hypovolemia: Secondary | ICD-10-CM | POA: Diagnosis not present

## 2024-06-24 DIAGNOSIS — Z906 Acquired absence of other parts of urinary tract: Secondary | ICD-10-CM

## 2024-06-24 DIAGNOSIS — R404 Transient alteration of awareness: Secondary | ICD-10-CM | POA: Diagnosis not present

## 2024-06-24 DIAGNOSIS — R7401 Elevation of levels of liver transaminase levels: Secondary | ICD-10-CM | POA: Diagnosis present

## 2024-06-24 DIAGNOSIS — Z66 Do not resuscitate: Secondary | ICD-10-CM | POA: Diagnosis not present

## 2024-06-24 DIAGNOSIS — Z882 Allergy status to sulfonamides status: Secondary | ICD-10-CM | POA: Diagnosis not present

## 2024-06-24 DIAGNOSIS — B961 Klebsiella pneumoniae [K. pneumoniae] as the cause of diseases classified elsewhere: Secondary | ICD-10-CM | POA: Diagnosis present

## 2024-06-24 DIAGNOSIS — Z79899 Other long term (current) drug therapy: Secondary | ICD-10-CM

## 2024-06-24 DIAGNOSIS — N390 Urinary tract infection, site not specified: Secondary | ICD-10-CM | POA: Diagnosis not present

## 2024-06-24 DIAGNOSIS — G9341 Metabolic encephalopathy: Secondary | ICD-10-CM | POA: Diagnosis not present

## 2024-06-24 DIAGNOSIS — Z7982 Long term (current) use of aspirin: Secondary | ICD-10-CM | POA: Diagnosis not present

## 2024-06-24 DIAGNOSIS — E876 Hypokalemia: Secondary | ICD-10-CM | POA: Diagnosis present

## 2024-06-24 DIAGNOSIS — F419 Anxiety disorder, unspecified: Secondary | ICD-10-CM | POA: Diagnosis present

## 2024-06-24 DIAGNOSIS — Z1152 Encounter for screening for COVID-19: Secondary | ICD-10-CM

## 2024-06-24 DIAGNOSIS — R4182 Altered mental status, unspecified: Principal | ICD-10-CM

## 2024-06-24 DIAGNOSIS — K7200 Acute and subacute hepatic failure without coma: Secondary | ICD-10-CM | POA: Diagnosis not present

## 2024-06-24 DIAGNOSIS — R6520 Severe sepsis without septic shock: Secondary | ICD-10-CM | POA: Diagnosis not present

## 2024-06-24 DIAGNOSIS — R5383 Other fatigue: Secondary | ICD-10-CM | POA: Diagnosis not present

## 2024-06-24 DIAGNOSIS — R410 Disorientation, unspecified: Secondary | ICD-10-CM | POA: Diagnosis not present

## 2024-06-24 DIAGNOSIS — R509 Fever, unspecified: Secondary | ICD-10-CM | POA: Diagnosis not present

## 2024-06-24 DIAGNOSIS — T7491XA Unspecified adult maltreatment, confirmed, initial encounter: Secondary | ICD-10-CM

## 2024-06-24 DIAGNOSIS — F32A Depression, unspecified: Secondary | ICD-10-CM | POA: Diagnosis present

## 2024-06-24 DIAGNOSIS — R0989 Other specified symptoms and signs involving the circulatory and respiratory systems: Secondary | ICD-10-CM | POA: Diagnosis not present

## 2024-06-24 DIAGNOSIS — Z743 Need for continuous supervision: Secondary | ICD-10-CM | POA: Diagnosis not present

## 2024-06-24 DIAGNOSIS — Z8551 Personal history of malignant neoplasm of bladder: Secondary | ICD-10-CM

## 2024-06-24 DIAGNOSIS — A419 Sepsis, unspecified organism: Secondary | ICD-10-CM | POA: Diagnosis not present

## 2024-06-24 LAB — URINALYSIS, W/ REFLEX TO CULTURE (INFECTION SUSPECTED)
Bilirubin Urine: NEGATIVE
Glucose, UA: NEGATIVE mg/dL
Ketones, ur: NEGATIVE mg/dL
Nitrite: NEGATIVE
Protein, ur: 100 mg/dL — AB
Specific Gravity, Urine: 1.01 (ref 1.005–1.030)
WBC, UA: >50 WBC/hpf (ref 0–5)
pH: 7 (ref 5.0–8.0)

## 2024-06-24 LAB — CBC WITH DIFFERENTIAL/PLATELET
Abs Immature Granulocytes: 0.25 K/uL — ABNORMAL HIGH (ref 0.00–0.07)
Basophils Absolute: 0.1 K/uL (ref 0.0–0.1)
Basophils Relative: 0 %
Eosinophils Absolute: 0 K/uL (ref 0.0–0.5)
Eosinophils Relative: 0 %
HCT: 39.1 % (ref 36.0–46.0)
Hemoglobin: 14.5 g/dL (ref 12.0–15.0)
Immature Granulocytes: 1 %
Lymphocytes Relative: 5 %
Lymphs Abs: 1 K/uL (ref 0.7–4.0)
MCH: 32.4 pg (ref 26.0–34.0)
MCHC: 37.1 g/dL — ABNORMAL HIGH (ref 30.0–36.0)
MCV: 87.3 fL (ref 80.0–100.0)
Monocytes Absolute: 2.8 K/uL — ABNORMAL HIGH (ref 0.1–1.0)
Monocytes Relative: 14 %
Neutro Abs: 16.1 K/uL — ABNORMAL HIGH (ref 1.7–7.7)
Neutrophils Relative %: 80 %
Platelets: 239 K/uL (ref 150–400)
RBC: 4.48 MIL/uL (ref 3.87–5.11)
RDW: 12.1 % (ref 11.5–15.5)
WBC: 20.3 K/uL — ABNORMAL HIGH (ref 4.0–10.5)
nRBC: 0 % (ref 0.0–0.2)

## 2024-06-24 LAB — COMPREHENSIVE METABOLIC PANEL WITH GFR
ALT: 100 U/L — ABNORMAL HIGH (ref 0–44)
AST: 99 U/L — ABNORMAL HIGH (ref 15–41)
Albumin: 2.9 g/dL — ABNORMAL LOW (ref 3.5–5.0)
Alkaline Phosphatase: 93 U/L (ref 38–126)
Anion gap: 14 (ref 5–15)
BUN: 47 mg/dL — ABNORMAL HIGH (ref 8–23)
CO2: 23 mmol/L (ref 22–32)
Calcium: 9 mg/dL (ref 8.9–10.3)
Chloride: 92 mmol/L — ABNORMAL LOW (ref 98–111)
Creatinine, Ser: 1.05 mg/dL — ABNORMAL HIGH (ref 0.44–1.00)
GFR, Estimated: 58 mL/min — ABNORMAL LOW (ref >60)
Glucose, Bld: 120 mg/dL — ABNORMAL HIGH (ref 70–99)
Potassium: 3.8 mmol/L (ref 3.5–5.1)
Sodium: 129 mmol/L — ABNORMAL LOW (ref 135–145)
Total Bilirubin: 1.2 mg/dL (ref 0.0–1.2)
Total Protein: 7.4 g/dL (ref 6.5–8.1)

## 2024-06-24 LAB — I-STAT CG4 LACTIC ACID, ED
Lactic Acid, Venous: 1.4 mmol/L (ref 0.5–1.9)
Lactic Acid, Venous: 1.4 mmol/L (ref 0.5–1.9)

## 2024-06-24 LAB — RESP PANEL BY RT-PCR (RSV, FLU A&B, COVID)  RVPGX2
Influenza A by PCR: NEGATIVE
Influenza B by PCR: NEGATIVE
Resp Syncytial Virus by PCR: NEGATIVE
SARS Coronavirus 2 by RT PCR: NEGATIVE

## 2024-06-24 LAB — LIPASE, BLOOD: Lipase: 36 U/L (ref 11–51)

## 2024-06-24 LAB — TSH: TSH: 1.734 u[IU]/mL (ref 0.350–4.500)

## 2024-06-24 MED ORDER — ENOXAPARIN SODIUM 40 MG/0.4ML IJ SOSY
40.0000 mg | PREFILLED_SYRINGE | Freq: Every day | INTRAMUSCULAR | Status: DC
  Administered 2024-06-24 – 2024-06-26 (×3): 40 mg via SUBCUTANEOUS
  Filled 2024-06-24 (×3): qty 0.4

## 2024-06-24 MED ORDER — SODIUM CHLORIDE 0.9 % IV SOLN: INTRAVENOUS | Status: AC

## 2024-06-24 MED ORDER — SODIUM CHLORIDE 0.9 % IV BOLUS
1000.0000 mL | Freq: Once | INTRAVENOUS | Status: AC
  Administered 2024-06-24: 1000 mL via INTRAVENOUS

## 2024-06-24 MED ORDER — SODIUM CHLORIDE 0.9 % IV SOLN
2.0000 g | Freq: Two times a day (BID) | INTRAVENOUS | Status: DC
  Administered 2024-06-24 – 2024-06-25 (×3): 2 g via INTRAVENOUS
  Filled 2024-06-24 (×4): qty 12.5

## 2024-06-24 MED ORDER — SODIUM CHLORIDE 0.9 % IV SOLN
1.0000 g | Freq: Once | INTRAVENOUS | Status: AC
  Administered 2024-06-24: 1 g via INTRAVENOUS
  Filled 2024-06-24: qty 10
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
## 2024-06-24 NOTE — ED Notes (Signed)
 Patient transported to CT
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
## 2024-06-24 NOTE — Progress Notes (Signed)
 Pharmacy Antibiotic Note  Colleen Key is a 68 y.o. female admitted on 06/24/2024 with sepsis/UTI.  Pharmacy has been consulted for Cefepime  dosing.  Plan: Cefepime  2gm IV q12h Will f/u renal function, micro data, and pt's clinical condition  Height: 5' 6 (167.6 cm) Weight: 74.4 kg (164 lb 0.4 oz) IBW/kg (Calculated) : 59.3  Temp (24hrs), Avg:99.9 F (37.7 C), Min:98.8 F (37.1 C), Max:101.6 F (38.7 C)  Recent Labs  Lab 06/24/24 1403 06/24/24 1410 06/24/24 1508 06/24/24 1740  WBC 20.3*  --   --   --   CREATININE  --   --   --  1.05*  LATICACIDVEN  --  1.4 1.4  --     Estimated Creatinine Clearance: 52.9 mL/min (A) (by C-G formula based on SCr of 1.05 mg/dL (H)).    Allergies  Allergen Reactions   Sulfa Antibiotics Swelling    Antimicrobials this admission: 7/19 Rocephin  x 1 7/20 Cefepime  >>   Microbiology results: 7/19 BCx:  7/19 UCx:    Thank you for allowing pharmacy to be a part of this patient's care.  Vito Ralph, PharmD, BCPS Please see amion for complete clinical pharmacist phone list 06/24/2024 11:32 PM

## 2024-06-24 NOTE — ED Triage Notes (Signed)
 Pt BIB [facility] from home for AMS and weakness. Pt A/O x2 person/place only. Husband reports pt has not been eating for the past 5 days.   168/68 HR 96 99% room air CBG 164

## 2024-06-24 NOTE — ED Notes (Signed)
 CCMD called.

## 2024-06-24 NOTE — Plan of Care (Signed)

## 2024-06-25 ENCOUNTER — Observation Stay (HOSPITAL_COMMUNITY)

## 2024-06-25 DIAGNOSIS — E785 Hyperlipidemia, unspecified: Secondary | ICD-10-CM | POA: Diagnosis present

## 2024-06-25 DIAGNOSIS — F32A Depression, unspecified: Secondary | ICD-10-CM | POA: Diagnosis present

## 2024-06-25 DIAGNOSIS — A419 Sepsis, unspecified organism: Secondary | ICD-10-CM

## 2024-06-25 DIAGNOSIS — Z9049 Acquired absence of other specified parts of digestive tract: Secondary | ICD-10-CM | POA: Diagnosis not present

## 2024-06-25 DIAGNOSIS — K219 Gastro-esophageal reflux disease without esophagitis: Secondary | ICD-10-CM | POA: Diagnosis present

## 2024-06-25 DIAGNOSIS — E876 Hypokalemia: Secondary | ICD-10-CM | POA: Diagnosis present

## 2024-06-25 DIAGNOSIS — Z7982 Long term (current) use of aspirin: Secondary | ICD-10-CM | POA: Diagnosis not present

## 2024-06-25 DIAGNOSIS — R7401 Elevation of levels of liver transaminase levels: Secondary | ICD-10-CM | POA: Diagnosis present

## 2024-06-25 DIAGNOSIS — K7200 Acute and subacute hepatic failure without coma: Secondary | ICD-10-CM

## 2024-06-25 DIAGNOSIS — Z66 Do not resuscitate: Secondary | ICD-10-CM | POA: Diagnosis present

## 2024-06-25 DIAGNOSIS — Z87891 Personal history of nicotine dependence: Secondary | ICD-10-CM | POA: Diagnosis not present

## 2024-06-25 DIAGNOSIS — R6520 Severe sepsis without septic shock: Secondary | ICD-10-CM | POA: Diagnosis not present

## 2024-06-25 DIAGNOSIS — Z8551 Personal history of malignant neoplasm of bladder: Secondary | ICD-10-CM | POA: Diagnosis not present

## 2024-06-25 DIAGNOSIS — N130 Hydronephrosis with ureteropelvic junction obstruction: Secondary | ICD-10-CM | POA: Diagnosis not present

## 2024-06-25 DIAGNOSIS — B961 Klebsiella pneumoniae [K. pneumoniae] as the cause of diseases classified elsewhere: Secondary | ICD-10-CM | POA: Diagnosis present

## 2024-06-25 DIAGNOSIS — I10 Essential (primary) hypertension: Secondary | ICD-10-CM

## 2024-06-25 DIAGNOSIS — E871 Hypo-osmolality and hyponatremia: Secondary | ICD-10-CM | POA: Diagnosis present

## 2024-06-25 DIAGNOSIS — Z906 Acquired absence of other parts of urinary tract: Secondary | ICD-10-CM | POA: Diagnosis not present

## 2024-06-25 DIAGNOSIS — Z1152 Encounter for screening for COVID-19: Secondary | ICD-10-CM | POA: Diagnosis not present

## 2024-06-25 DIAGNOSIS — Z882 Allergy status to sulfonamides status: Secondary | ICD-10-CM | POA: Diagnosis not present

## 2024-06-25 DIAGNOSIS — E861 Hypovolemia: Secondary | ICD-10-CM | POA: Diagnosis present

## 2024-06-25 DIAGNOSIS — Z79899 Other long term (current) drug therapy: Secondary | ICD-10-CM | POA: Diagnosis not present

## 2024-06-25 DIAGNOSIS — F419 Anxiety disorder, unspecified: Secondary | ICD-10-CM | POA: Diagnosis present

## 2024-06-25 DIAGNOSIS — N390 Urinary tract infection, site not specified: Secondary | ICD-10-CM | POA: Diagnosis not present

## 2024-06-25 DIAGNOSIS — G9341 Metabolic encephalopathy: Secondary | ICD-10-CM | POA: Diagnosis not present

## 2024-06-25 DIAGNOSIS — N1330 Unspecified hydronephrosis: Secondary | ICD-10-CM | POA: Diagnosis not present

## 2024-06-25 DIAGNOSIS — E860 Dehydration: Secondary | ICD-10-CM | POA: Diagnosis present

## 2024-06-25 LAB — CBC
HCT: 34.5 % — ABNORMAL LOW (ref 36.0–46.0)
Hemoglobin: 12.5 g/dL (ref 12.0–15.0)
MCH: 32 pg (ref 26.0–34.0)
MCHC: 36.2 g/dL — ABNORMAL HIGH (ref 30.0–36.0)
MCV: 88.2 fL (ref 80.0–100.0)
Platelets: 206 K/uL (ref 150–400)
RBC: 3.91 MIL/uL (ref 3.87–5.11)
RDW: 12.2 % (ref 11.5–15.5)
WBC: 17.5 K/uL — ABNORMAL HIGH (ref 4.0–10.5)
nRBC: 0 % (ref 0.0–0.2)

## 2024-06-25 LAB — BASIC METABOLIC PANEL WITH GFR
Anion gap: 12 (ref 5–15)
Anion gap: 14 (ref 5–15)
BUN: 43 mg/dL — ABNORMAL HIGH (ref 8–23)
BUN: 40 mg/dL — ABNORMAL HIGH (ref 8–23)
CO2: 25 mmol/L (ref 22–32)
CO2: 20 mmol/L — ABNORMAL LOW (ref 22–32)
Calcium: 8.8 mg/dL — ABNORMAL LOW (ref 8.9–10.3)
Calcium: 8.4 mg/dL — ABNORMAL LOW (ref 8.9–10.3)
Chloride: 93 mmol/L — ABNORMAL LOW (ref 98–111)
Chloride: 96 mmol/L — ABNORMAL LOW (ref 98–111)
Creatinine, Ser: 1.01 mg/dL — ABNORMAL HIGH (ref 0.44–1.00)
Creatinine, Ser: 1 mg/dL (ref 0.44–1.00)
GFR, Estimated: >60 mL/min (ref >60)
GFR, Estimated: >60 mL/min (ref >60)
Glucose, Bld: 137 mg/dL — ABNORMAL HIGH (ref 70–99)
Glucose, Bld: 112 mg/dL — ABNORMAL HIGH (ref 70–99)
Potassium: 3.2 mmol/L — ABNORMAL LOW (ref 3.5–5.1)
Potassium: 3.1 mmol/L — ABNORMAL LOW (ref 3.5–5.1)
Sodium: 130 mmol/L — ABNORMAL LOW (ref 135–145)
Sodium: 130 mmol/L — ABNORMAL LOW (ref 135–145)

## 2024-06-25 LAB — AMMONIA: Ammonia: 15 umol/L (ref 9–35)

## 2024-06-25 LAB — HEPATITIS PANEL, ACUTE
HCV Ab: NONREACTIVE
Hep A IgM: NONREACTIVE
Hep B C IgM: NONREACTIVE
Hepatitis B Surface Ag: NONREACTIVE

## 2024-06-25 LAB — HEPATIC FUNCTION PANEL
ALT: 101 U/L — ABNORMAL HIGH (ref 0–44)
AST: 90 U/L — ABNORMAL HIGH (ref 15–41)
Albumin: 2.4 g/dL — ABNORMAL LOW (ref 3.5–5.0)
Alkaline Phosphatase: 83 U/L (ref 38–126)
Bilirubin, Direct: 0.2 mg/dL (ref 0.0–0.2)
Indirect Bilirubin: 0.4 mg/dL (ref 0.3–0.9)
Total Bilirubin: 0.6 mg/dL (ref 0.0–1.2)
Total Protein: 6.4 g/dL — ABNORMAL LOW (ref 6.5–8.1)

## 2024-06-25 LAB — HIV ANTIBODY (ROUTINE TESTING W REFLEX): HIV Screen 4th Generation wRfx: NONREACTIVE

## 2024-06-25 MED ORDER — POTASSIUM CHLORIDE CRYS ER 20 MEQ PO TBCR
40.0000 meq | EXTENDED_RELEASE_TABLET | Freq: Every day | ORAL | Status: DC
  Administered 2024-06-25: 40 meq via ORAL
  Filled 2024-06-25 (×2): qty 2

## 2024-06-25 MED ORDER — POTASSIUM CHLORIDE CRYS ER 20 MEQ PO TBCR
40.0000 meq | EXTENDED_RELEASE_TABLET | Freq: Once | ORAL | Status: AC
  Administered 2024-06-25: 40 meq via ORAL
  Filled 2024-06-25: qty 2

## 2024-06-25 NOTE — Plan of Care (Signed)

## 2024-06-25 NOTE — Care Management Obs Status (Addendum)
 MEDICARE OBSERVATION STATUS NOTIFICATION   Patient Details  Name: Colleen Key MRN: [id number] Date of Birth: July 15, 1956   Medicare Observation Status Notification Given:  Yes Patient was alert and oriented at the time she signed this form     Marval Gell, RN 06/25/2024, 12:59 PM

## 2024-06-25 NOTE — Progress Notes (Signed)
 Progress Note   Patient: Colleen Key FMW:[id number] DOB: November 14, 1956 DOA: 06/24/2024     0 DOS: the patient was seen and examined on 06/25/2024   Brief hospital course:  Colleen Key is a 68 y.o. 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 intake. H/o recurrent UTI last one a year ago.  WBC 20K, UA with evidence of pyuria and bacteriuria, cultures obtained admitted to hospitalist service for further management evaluation of sepsis secondary to UTI.  Assessment and Plan: Sepsis secondary to UTI Patient had fever, high WBC, borderline low blood pressures, urinalysis consistent with UTI.  Patient will be continued on cefepime  therapy.  Continue gentle IV fluids.  Monitor vitals closely.  Follow-up urine cultures, blood cultures.  Acute metabolic encephalopathy- In the setting of UTI. Patient's mental status better today, able to answer me appropriately. Continue neurochecks. Supportive care. Fall, aspiration precautions.  Hyponatremia Possibly due to hypovolemia, in the setting of sepsis. Continue gentle IV fluids. Trend sodium.  Hypokalemia: Potassium 3.1 today. Oral replacement ordered. Monitor daily electrolytes.  Transaminitis: Possibly in the setting of sepsis. Avoid hepatotoxic drugs. Hep panel, RUQ sono ordered.  Trend LFT.  Hypertension- BP slower side. Hold antihypertensives.      Out of bed to chair. Incentive spirometry. Nursing supportive care. Fall, aspiration precautions. Diet:  Diet Orders (From admission, onward)     Start     Ordered   06/24/24 2327  Diet Heart Room service appropriate? Yes; Fluid consistency: Thin  Diet effective now        Question Answer Comment  Room service appropriate? Yes   Fluid consistency: Thin      06/24/24 2328           DVT prophylaxis: enoxaparin  (LOVENOX ) injection 40 mg Start: 06/24/24 2345  Level of care: Telemetry Medical   Code Status: Do not attempt resuscitation (DNR) PRE-ARREST INTERVENTIONS DESIRED  Subjective: Patient is seen and examined today morning. She is lying in bed, comfortable. Wishes to go home. Denies any complaints.  Physical Exam: Vitals:   06/24/24 2327 06/24/24 2328 06/25/24 0402 06/25/24 0842  BP: 110/71  (!) 104/59 (!) 103/55  Pulse: 80  74 86  Resp: 18  16 18   Temp: 100.1 F (37.8 C)  97.8 F (36.6 C) 100 F (37.8 C)  TempSrc:   Oral Oral  SpO2: 97%  95% 96%  Weight:  74.4 kg    Height:        General - Elderly Caucasian female, no apparent distress HEENT - PERRLA, EOMI, atraumatic head, non tender sinuses. Lung - Clear, basal rales, rhonchi, wheezes. Heart - S1, S2 heard, no murmurs, rubs, trace pedal edema. Abdomen - Soft, non tender, bowel sounds good Neuro - Alert, awake and oriented x 3, non focal exam. Skin - Warm and dry.  Data Reviewed:      Latest Ref Rng & Units 06/25/2024    5:27 AM 06/24/2024    2:03 PM  CBC  WBC 4.0 - 10.5 K/uL 17.5  20.3   Hemoglobin 12.0 - 15.0 g/dL 87.4  85.4   Hematocrit 36.0 - 46.0 % 34.5  39.1   Platelets 150 - 400 K/uL 206  239       Latest Ref Rng & Units 06/25/2024  5:27 AM 06/25/2024   12:30 AM 06/24/2024    5:40 PM  BMP  Glucose 70 - 99 mg/dL 887  862  879   BUN 8 - 23 mg/dL 40  43  47   Creatinine 0.44 - 1.00 mg/dL 8.99  8.98  8.94   Sodium 135 - 145 mmol/L 130  130  129   Potassium 3.5 - 5.1 mmol/L 3.1  3.2  3.8   Chloride 98 - 111 mmol/L 96  93  92   CO2 22 - 32 mmol/L 20  25  23    Calcium 8.9 - 10.3 mg/dL 8.4  8.8  9.0    CT Head Wo Contrast Result Date: 06/24/2024 CLINICAL DATA:  Mental status change, unknown cause EXAM: CT HEAD WITHOUT CONTRAST TECHNIQUE: Contiguous axial images  were obtained from the base of the skull through the vertex without intravenous contrast. RADIATION DOSE REDUCTION: This exam was performed according to the departmental dose-optimization program which includes automated exposure control, adjustment of the mA and/or kV according to patient size and/or use of iterative reconstruction technique. COMPARISON:  None Available. FINDINGS: Brain: No evidence of large-territorial acute infarction. No parenchymal hemorrhage. No mass lesion. No extra-axial collection. No mass effect or midline shift. No hydrocephalus. Basilar cisterns are patent. Vascular: No hyperdense vessel. Skull: No acute fracture or focal lesion. Sinuses/Orbits: Paranasal sinuses and mastoid air cells are clear. The orbits are unremarkable. Other: None. IMPRESSION: No acute intracranial abnormality. Electronically Signed   By: Morgane  Naveau M.D.   On: 06/24/2024 16:10   DG Chest Portable 1 View Result Date: 06/24/2024 CLINICAL DATA:  Fever, fatigue EXAM: PORTABLE CHEST 1 VIEW COMPARISON:  None Available. FINDINGS: Mildly low lung volumes, slightly limiting evaluation. No focal consolidation. No pleural effusions. No pneumothorax. Cardiomediastinal silhouette is mildly prominent, likely exaggerated by low lung volumes and AP technique. No acute osseous findings. IMPRESSION: Mildly low lung volumes.  No acute airspace disease. Electronically Signed   By: Michaeline Blanch M.D.   On: 06/24/2024 13:51    Family Communication: Discussed with patient, family at bedside. They understand and agree. All questions answered.  Disposition: Status is: Observation The patient remains OBS appropriate and will d/c before 2 midnights.  Planned Discharge Destination: Home     Time spent: 40 minutes  Author: Concepcion Riser, MD 06/25/2024 12:53 PM Secure chat 7am to 7pm For on call review [email or web address].

## 2024-06-26 ENCOUNTER — Inpatient Hospital Stay (HOSPITAL_COMMUNITY)

## 2024-06-26 DIAGNOSIS — A419 Sepsis, unspecified organism: Secondary | ICD-10-CM | POA: Diagnosis not present

## 2024-06-26 DIAGNOSIS — G9341 Metabolic encephalopathy: Secondary | ICD-10-CM | POA: Diagnosis not present

## 2024-06-26 DIAGNOSIS — I10 Essential (primary) hypertension: Secondary | ICD-10-CM | POA: Diagnosis not present

## 2024-06-26 DIAGNOSIS — N390 Urinary tract infection, site not specified: Secondary | ICD-10-CM | POA: Diagnosis not present

## 2024-06-26 LAB — COMPREHENSIVE METABOLIC PANEL WITH GFR
ALT: 111 U/L — ABNORMAL HIGH (ref 0–44)
AST: 82 U/L — ABNORMAL HIGH (ref 15–41)
Albumin: 2.4 g/dL — ABNORMAL LOW (ref 3.5–5.0)
Alkaline Phosphatase: 96 U/L (ref 38–126)
Anion gap: 9 (ref 5–15)
BUN: 23 mg/dL (ref 8–23)
CO2: 24 mmol/L (ref 22–32)
Calcium: 8.7 mg/dL — ABNORMAL LOW (ref 8.9–10.3)
Chloride: 100 mmol/L (ref 98–111)
Creatinine, Ser: 0.77 mg/dL (ref 0.44–1.00)
GFR, Estimated: >60 mL/min (ref >60)
Glucose, Bld: 104 mg/dL — ABNORMAL HIGH (ref 70–99)
Potassium: 3.3 mmol/L — ABNORMAL LOW (ref 3.5–5.1)
Sodium: 133 mmol/L — ABNORMAL LOW (ref 135–145)
Total Bilirubin: 0.6 mg/dL (ref 0.0–1.2)
Total Protein: 6.3 g/dL — ABNORMAL LOW (ref 6.5–8.1)

## 2024-06-26 LAB — CBC
HCT: 33.9 % — ABNORMAL LOW (ref 36.0–46.0)
Hemoglobin: 12.2 g/dL (ref 12.0–15.0)
MCH: 31.9 pg (ref 26.0–34.0)
MCHC: 36 g/dL (ref 30.0–36.0)
MCV: 88.5 fL (ref 80.0–100.0)
Platelets: 229 K/uL (ref 150–400)
RBC: 3.83 MIL/uL — ABNORMAL LOW (ref 3.87–5.11)
RDW: 12.3 % (ref 11.5–15.5)
WBC: 15.6 K/uL — ABNORMAL HIGH (ref 4.0–10.5)
nRBC: 0 % (ref 0.0–0.2)

## 2024-06-26 LAB — URINE CULTURE: Culture: >=100000 — AB

## 2024-06-26 MED ORDER — SODIUM CHLORIDE 0.9 % IV SOLN
1.0000 g | INTRAVENOUS | Status: AC
  Administered 2024-06-26: 1 g via INTRAVENOUS
  Filled 2024-06-26: qty 10

## 2024-06-26 MED ORDER — PANTOPRAZOLE SODIUM 40 MG PO TBEC
40.0000 mg | DELAYED_RELEASE_TABLET | Freq: Every day | ORAL | Status: DC
  Administered 2024-06-26 – 2024-06-27 (×2): 40 mg via ORAL
  Filled 2024-06-26 (×2): qty 1

## 2024-06-26 MED ORDER — ESCITALOPRAM OXALATE 10 MG PO TABS
20.0000 mg | ORAL_TABLET | Freq: Every day | ORAL | Status: DC
Start: 2024-06-26 — End: 2024-06-27
  Administered 2024-06-26 – 2024-06-27 (×2): 20 mg via ORAL
  Filled 2024-06-26 (×2): qty 2

## 2024-06-26 MED ORDER — ARIPIPRAZOLE 2 MG PO TABS
2.0000 mg | ORAL_TABLET | Freq: Every day | ORAL | Status: DC
  Administered 2024-06-26 – 2024-06-27 (×2): 2 mg via ORAL
  Filled 2024-06-26 (×2): qty 1

## 2024-06-26 MED ORDER — ALPRAZOLAM 0.25 MG PO TABS: 0.2500 mg | ORAL_TABLET | Freq: Three times a day (TID) | ORAL | Status: DC | PRN

## 2024-06-26 MED ORDER — CHLORHEXIDINE GLUCONATE CLOTH 2 % EX PADS
6.0000 | MEDICATED_PAD | Freq: Every day | CUTANEOUS | Status: DC
  Administered 2024-06-26 – 2024-06-27 (×2): 6 via TOPICAL

## 2024-06-26 MED ORDER — BUPROPION HCL ER (XL) 150 MG PO TB24
300.0000 mg | ORAL_TABLET | Freq: Every day | ORAL | Status: DC
Start: 2024-06-26 — End: 2024-06-27
  Administered 2024-06-26 – 2024-06-27 (×2): 300 mg via ORAL
  Filled 2024-06-26 (×2): qty 2

## 2024-06-26 MED ORDER — POTASSIUM CHLORIDE CRYS ER 20 MEQ PO TBCR
40.0000 meq | EXTENDED_RELEASE_TABLET | Freq: Every day | ORAL | Status: DC
  Administered 2024-06-26 – 2024-06-27 (×2): 40 meq via ORAL
  Filled 2024-06-26: qty 2

## 2024-06-26 MED ORDER — BUPROPION HCL ER (XL) 150 MG PO TB24: 150.0000 mg | ORAL_TABLET | Freq: Every day | ORAL | Status: DC

## 2024-06-26 NOTE — Plan of Care (Signed)
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
## 2024-06-26 NOTE — Progress Notes (Signed)
 Transition of Care [facility]) - Inpatient Brief Assessment   Patient Details  Name: Colleen Key MRN: [id number] Date of Birth: [birth year]  [facility] CM/SW Contact:    Rosaline JONELLE Joe, RN Phone Number: 06/26/2024, 3:05 PM   Clinical Narrative: CM met with the patient at the bedside and patient admitted to the hospital with Sepsis.  Patient is S/P radical cystectomy and diversion in [place] in 2006.  Patient states that she performs self-catheterizations at home.  Patient is currently on IV antibiotics.  Patient is independent at home and drives.  Patient states that she feels safe at home and denies problems/safety concerns at home.  Patient lives with her spouse and plans to return home when stable for discharge.  No DME at the home - other than self-cath supplies.  No TOC needs at this time.   Transition of Care Asessment: Insurance and Status: (P) Insurance coverage has been reviewed Patient has primary care physician: (P) Yes Home environment has been reviewed: (P) from home with spouse Prior level of function:: (P) Independent Prior/Current Home Services: (P) No current home services Social Drivers of Health Review: (P) SDOH reviewed no interventions necessary Readmission risk has been reviewed: (P) Yes Transition of care needs: (P) no transition of care needs at this time

## 2024-06-26 NOTE — Progress Notes (Signed)
   06/26/24 1043  Spiritual Encounters  Type of Visit Initial  Care provided to: Patient  Reason for visit Advance directives  OnCall Visit No   Chaplain went to Patient's room for AD but per Patient, she does not need AD info. Chaplain and Patient had a beautiful visit in which the Chaplain provided spiritual care and comfort to the Patient. Patient shared her 20 year history with bladder cancer and her past skin cancer. Patient is a person of faith and during our visit, Patient affirmed her belief in God taking care of her and being with her always. Patient is being discharged today. No further spiritual need.  Chaplain Therisa Samuel

## 2024-06-27 ENCOUNTER — Other Ambulatory Visit (HOSPITAL_COMMUNITY): Payer: Self-pay

## 2024-06-27 DIAGNOSIS — N390 Urinary tract infection, site not specified: Secondary | ICD-10-CM | POA: Diagnosis not present

## 2024-06-27 LAB — CBC
HCT: 36 % (ref 36.0–46.0)
Hemoglobin: 12.8 g/dL (ref 12.0–15.0)
MCH: 31.7 pg (ref 26.0–34.0)
MCHC: 35.6 g/dL (ref 30.0–36.0)
MCV: 89.1 fL (ref 80.0–100.0)
Platelets: 265 K/uL (ref 150–400)
RBC: 4.04 MIL/uL (ref 3.87–5.11)
RDW: 12.3 % (ref 11.5–15.5)
WBC: 13.4 K/uL — ABNORMAL HIGH (ref 4.0–10.5)
nRBC: 0 % (ref 0.0–0.2)

## 2024-06-27 LAB — COMPREHENSIVE METABOLIC PANEL WITH GFR
ALT: 118 U/L — ABNORMAL HIGH (ref 0–44)
AST: 70 U/L — ABNORMAL HIGH (ref 15–41)
Albumin: 2.6 g/dL — ABNORMAL LOW (ref 3.5–5.0)
Alkaline Phosphatase: 110 U/L (ref 38–126)
Anion gap: 10 (ref 5–15)
BUN: 18 mg/dL (ref 8–23)
CO2: 25 mmol/L (ref 22–32)
Calcium: 9.4 mg/dL (ref 8.9–10.3)
Chloride: 100 mmol/L (ref 98–111)
Creatinine, Ser: 0.76 mg/dL (ref 0.44–1.00)
GFR, Estimated: >60 mL/min (ref >60)
Glucose, Bld: 106 mg/dL — ABNORMAL HIGH (ref 70–99)
Potassium: 3.7 mmol/L (ref 3.5–5.1)
Sodium: 135 mmol/L (ref 135–145)
Total Bilirubin: 0.5 mg/dL (ref 0.0–1.2)
Total Protein: 6.9 g/dL (ref 6.5–8.1)

## 2024-06-27 MED ORDER — CEPHALEXIN 500 MG PO CAPS
500.0000 mg | ORAL_CAPSULE | Freq: Three times a day (TID) | ORAL | 0 refills | Status: AC
  Filled 2024-06-27: qty 9, 3d supply, fill #0

## 2024-06-27 MED ORDER — CEPHALEXIN 500 MG PO CAPS: 500.0000 mg | ORAL_CAPSULE | Freq: Three times a day (TID) | ORAL | Status: DC
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
## 2024-06-27 NOTE — Progress Notes (Signed)
 Home medications returned to patient; TOC meds given to patient.  Patient request to eat lunch before discharging.

## 2024-06-27 NOTE — Plan of Care (Signed)

## 2024-06-27 NOTE — Progress Notes (Signed)
 Patient called out complaining of ABD distendion and urine leaking around foley catheter. Foley care completed, balloon deflated, and advanced into the bladder. No urine return noted. Foley catheter was then irrigated with 30 mL sterile saline with sediment noted on return. Foley catheter remained patent after with 900 mL output noted. Patient reports feeling complete improvement and resolution of symptoms after foley was irrigated and drained. Patient left in NAD.

## 2024-06-29 LAB — CULTURE, BLOOD (ROUTINE X 2)
Culture: NO GROWTH
Culture: NO GROWTH
Special Requests: ADEQUATE

## 2024-07-05 ENCOUNTER — Telehealth: Payer: Self-pay | Admitting: *Deleted

## 2024-07-05 ENCOUNTER — Telehealth: Payer: Self-pay

## 2024-07-05 DIAGNOSIS — Z09 Encounter for follow-up examination after completed treatment for conditions other than malignant neoplasm: Secondary | ICD-10-CM | POA: Diagnosis not present

## 2024-07-05 DIAGNOSIS — C689 Malignant neoplasm of urinary organ, unspecified: Secondary | ICD-10-CM | POA: Diagnosis not present

## 2024-07-05 DIAGNOSIS — F32A Depression, unspecified: Secondary | ICD-10-CM

## 2024-07-05 DIAGNOSIS — N390 Urinary tract infection, site not specified: Secondary | ICD-10-CM | POA: Diagnosis not present

## 2024-07-05 DIAGNOSIS — E871 Hypo-osmolality and hyponatremia: Secondary | ICD-10-CM | POA: Diagnosis not present

## 2024-07-05 DIAGNOSIS — N1330 Unspecified hydronephrosis: Secondary | ICD-10-CM | POA: Diagnosis not present

## 2024-07-05 DIAGNOSIS — R7989 Other specified abnormal findings of blood chemistry: Secondary | ICD-10-CM | POA: Diagnosis not present

## 2024-07-05 DIAGNOSIS — Z789 Other specified health status: Secondary | ICD-10-CM | POA: Diagnosis not present

## 2024-07-05 NOTE — Progress Notes (Signed)
 Complex Care Management Note  Care Guide Note 07/05/2024 Name: Colleen Key MRN: [id number] DOB: Apr 06, 1956  Colleen Key is a 68 y.o. year old female who sees Royden, Ronal Czar, FNP for primary care. I reached out to [name] by phone today to offer complex care management services.  [name] was given information about Complex Care Management services today including:   The Complex Care Management services include support from the care team which includes your Nurse Care Manager, Clinical Social Worker, or Pharmacist.  The Complex Care Management team is here to help remove barriers to the health concerns and goals most important to you. Complex Care Management services are voluntary, and the patient may decline or stop services at any time by request to their care team member.   Complex Care Management Consent Status: Patient agreed to services and verbal consent obtained.   Follow up plan:  Telephone appointment with complex care management team member scheduled for:  07/27/2024  Encounter Outcome:  Patient Scheduled  Thedford Franks, CMA [place]  [facility] Guide Direct Dial: [phone]  Fax: [phone] Website: [email or web address]

## 2024-07-09 LAB — LAB REPORT - SCANNED
A1c: 5.6
EGFR: 80
TSH: 2.65 (ref 0.41–5.90)

## 2024-07-13 ENCOUNTER — Ambulatory Visit: Admitting: Psychiatry

## 2024-07-13 DIAGNOSIS — F331 Major depressive disorder, recurrent, moderate: Secondary | ICD-10-CM
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
## 2024-07-14 DIAGNOSIS — H43393 Other vitreous opacities, bilateral: Secondary | ICD-10-CM | POA: Diagnosis not present

## 2024-07-15 ENCOUNTER — Ambulatory Visit: Payer: Self-pay | Admitting: Urology

## 2024-07-25 ENCOUNTER — Ambulatory Visit: Admitting: Physician Assistant

## 2024-07-25 ENCOUNTER — Encounter: Payer: Self-pay | Admitting: Physician Assistant

## 2024-07-25 VITALS — BP 130/78 | HR 90 | Ht 67.0 in | Wt 176.0 lb

## 2024-07-25 DIAGNOSIS — N390 Urinary tract infection, site not specified: Secondary | ICD-10-CM | POA: Diagnosis not present

## 2024-07-25 MED ORDER — METHENAMINE HIPPURATE 1 G PO TABS: 1.0000 g | ORAL_TABLET | Freq: Two times a day (BID) | ORAL | 11 refills | Status: DC

## 2024-07-27 ENCOUNTER — Other Ambulatory Visit: Payer: Self-pay | Admitting: *Deleted

## 2024-07-27 NOTE — Patient Outreach (Signed)
 [facility] referral received for social worker. Patient contacted to introduce case management services and complete needs assessment. Patient confirmed that she is active with a therapist, (next appointment 08/03/24) and psychiatrist with [facility](next appointment 08/10/24). Patient confirmed having no needs at this time.   Anakaren Campion, LCSW [place]  [facility], [facility] Licensed Clinical Social Worker  Direct Dial: [phone]

## 2024-07-29 NOTE — Progress Notes (Signed)
 07/25/2024 10:27 AM   Colleen Key Aug 17, 1956 [id number]  CC: Chief Complaint  Patient presents with   Establish Care   Hydronephrosis   HPI: Colleen Key is a 68 y.o. female with PMH bladder cancer s/p radical cystectomy with continent diversion in 2006 managed with [facility] per urethra who presents today for outpatient follow-up.   She was admitted at [facility] from 06/24/2024 to 06/27/2024 with sepsis due to UTI.  Urine culture grew ampicillin and Macrobid resistant Klebsiella pneumoniae.  She was treated with culture appropriate Keflex .  Imaging showed bilateral hydronephrosis, which has been chronic for her following urinary diversion.  Today she reports feeling much better since discharge from the [facility] with no acute concerns today.  She has previously been on suppressive Macrobid.  PMH: Past Medical History:  Diagnosis Date   Bladder cancer Colleen Key) 1998    Surgical History: No past surgical history on file.  Home Medications:  Allergies as of 07/25/2024       Reactions   Sulfa Antibiotics Swelling        Medication List        Accurate as of July 25, 2024 11:59 PM. If you have any questions, ask your nurse or doctor.          ALPRAZolam  0.5 MG tablet Commonly known as: Xanax  Take one tablet at bedtime as needed for sleep.   ARIPiprazole  2 MG tablet Commonly known as: Abilify  Take 1 tablet (2 mg total) by mouth daily.   aspirin EC 81 MG tablet Take 81 mg by mouth daily. Swallow whole.   buPROPion  150 MG 24 hr tablet Commonly known as: WELLBUTRIN  XL Take 150 mg by mouth daily.   cetirizine 10 MG tablet Commonly known as: ZYRTEC   Cholecalciferol 50 MCG (2000 UT) Tabs Take by mouth.   escitalopram  20 MG tablet Commonly known as: LEXAPRO  Take 1 tablet (20 mg total) by mouth daily.   Magnesium 250 MG Tabs   methenamine  1 g tablet Commonly known as: HIPREX  Take 1 tablet (1 g total) by mouth 2 (two) times daily with a meal. Started by:  Vondell Babers   pantoprazole  40 MG tablet Commonly known as: PROTONIX  Take 40 mg by mouth daily.        Allergies:  Allergies  Allergen Reactions   Sulfa Antibiotics Swelling    Family History: Family History  Problem Relation Age of Onset   Breast cancer Neg Hx     Social History:   reports that she has quit smoking. Her smoking use included cigarettes. She has never used smokeless tobacco. No history on file for alcohol use and drug use.  Physical Exam: BP 130/78 (BP Location: Left Arm, Patient Position: Sitting, Cuff Size: Normal)   Pulse 90   Ht 5' 7 (1.702 m)   Wt 176 lb (79.8 kg)   SpO2 95%   BMI 27.57 kg/m   Constitutional:  Alert and oriented, no acute distress, nontoxic appearing HEENT: [place], AT Cardiovascular: No clubbing, cyanosis, or edema Respiratory: Normal respiratory effort, no increased work of breathing Skin: No rashes, bruises or suspicious lesions Neurologic: Grossly intact, no focal deficits, moving all 4 extremities Psychiatric: Normal mood and affect  Assessment & Plan:   1. Recurrent UTI (Primary) She understands that she is anticipated to be colonized in the setting of urinary diversion.  Her most recent urine culture was resistant to the suppressive agent she uses.  We discussed various options including continuing Macrobid and switching if her infections  become more frequent, or switching to a different agent now.  She prefers the latter, which is reasonable.  I recommended that we try Hiprex  for now, since this is not going to worsen her resistance pattern and since she is going to be at increased risk for UTIs in the future given diversion and CIC.  She agrees. - methenamine  (HIPREX ) 1 g tablet; Take 1 tablet (1 g total) by mouth 2 (two) times daily with a meal.  Dispense: 60 tablet; Refill: 11   Return for Keep follow-up as scheduled.  Lucie Hones, PA-C  [facility] [place] [address], [address] [place], [place] [address] [phone]

## 2024-08-03 ENCOUNTER — Telehealth: Admitting: Adult Health

## 2024-08-03 ENCOUNTER — Encounter: Payer: Self-pay | Admitting: Adult Health

## 2024-08-03 DIAGNOSIS — F331 Major depressive disorder, recurrent, moderate: Secondary | ICD-10-CM | POA: Diagnosis not present

## 2024-08-03 DIAGNOSIS — G4700 Insomnia, unspecified: Secondary | ICD-10-CM | POA: Diagnosis not present

## 2024-08-03 DIAGNOSIS — F410 Panic disorder [episodic paroxysmal anxiety] without agoraphobia: Secondary | ICD-10-CM

## 2024-08-03 DIAGNOSIS — F4310 Post-traumatic stress disorder, unspecified: Secondary | ICD-10-CM | POA: Diagnosis not present

## 2024-08-03 MED ORDER — ALPRAZOLAM 0.5 MG PO TABS: ORAL_TABLET | ORAL | 2 refills | Status: DC

## 2024-08-03 MED ORDER — BUPROPION HCL ER (XL) 150 MG PO TB24: 150.0000 mg | ORAL_TABLET | Freq: Every day | ORAL | 1 refills | Status: AC
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
## 2024-08-09 ENCOUNTER — Other Ambulatory Visit: Payer: Self-pay | Admitting: Registered Nurse

## 2024-08-09 DIAGNOSIS — Z1231 Encounter for screening mammogram for malignant neoplasm of breast: Secondary | ICD-10-CM

## 2024-08-10 ENCOUNTER — Ambulatory Visit: Admitting: Psychiatry

## 2024-08-10 DIAGNOSIS — F331 Major depressive disorder, recurrent, moderate: Secondary | ICD-10-CM
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
## 2024-08-15 DIAGNOSIS — K219 Gastro-esophageal reflux disease without esophagitis: Secondary | ICD-10-CM | POA: Diagnosis not present

## 2024-08-15 DIAGNOSIS — E785 Hyperlipidemia, unspecified: Secondary | ICD-10-CM | POA: Diagnosis not present

## 2024-08-15 DIAGNOSIS — R7309 Other abnormal glucose: Secondary | ICD-10-CM | POA: Diagnosis not present

## 2024-08-15 DIAGNOSIS — Z1329 Encounter for screening for other suspected endocrine disorder: Secondary | ICD-10-CM | POA: Diagnosis not present

## 2024-08-15 DIAGNOSIS — Z130 Encounter for screening for diseases of the blood and blood-forming organs and certain disorders involving the immune mechanism: Secondary | ICD-10-CM | POA: Diagnosis not present

## 2024-08-22 DIAGNOSIS — I10 Essential (primary) hypertension: Secondary | ICD-10-CM | POA: Diagnosis not present

## 2024-08-30 ENCOUNTER — Encounter: Payer: Self-pay | Admitting: *Deleted

## 2024-08-31 ENCOUNTER — Ambulatory Visit: Attending: Cardiology | Admitting: Cardiology

## 2024-08-31 ENCOUNTER — Encounter: Payer: Self-pay | Admitting: Cardiology

## 2024-08-31 VITALS — BP 126/82 | HR 80 | Ht 67.0 in | Wt 186.2 lb

## 2024-08-31 DIAGNOSIS — I10 Essential (primary) hypertension: Secondary | ICD-10-CM | POA: Diagnosis not present

## 2024-08-31 DIAGNOSIS — R931 Abnormal findings on diagnostic imaging of heart and coronary circulation: Secondary | ICD-10-CM

## 2024-08-31 NOTE — Patient Instructions (Signed)

## 2024-08-31 NOTE — Progress Notes (Signed)
 Cardiology Office Note:    Date:  09/02/2024   ID:  Colleen Key, DOB Jan 19, 1956, MRN [id number]  PCP:  [name], FNP  Cardiologist:  Dub Huntsman, DO  Electrophysiologist:  None   Referring MD: [name], FNP    I am doing well  History of Present Illness:    Colleen Key is a 68 y.o. female with a hx of coronary artery calcification by CT, hypertension, history of bladder cancer, former smoker.   She has followed with Dr. Ronal Ross in the past.  This is my first visit with her.  She offers no complaints at this time.  This experience was just for us  to establish cardiovascular care.  She tells me since her visit with Dr. Ross she has had some hospitalizations where she was treated for sepsis.  She was admitted to [facility] appears to be sepsis secondary to UTI.  This was in July 2025.  Past Medical History:  Diagnosis Date   Bladder cancer (HCC) 1998    No past surgical history on file.  Current Medications: Current Meds  Medication Sig   ALPRAZolam  (XANAX ) 0.5 MG tablet Take one tablet at bedtime as needed for sleep.   ARIPiprazole  (ABILIFY ) 2 MG tablet Take 1 tablet (2 mg total) by mouth daily.   aspirin EC 81 MG tablet Take 81 mg by mouth daily. Swallow whole.   buPROPion  (WELLBUTRIN  XL) 150 MG 24 hr tablet Take 1 tablet (150 mg total) by mouth daily.   cetirizine (ZYRTEC) 10 MG tablet    chlorthalidone  (HYGROTON ) 25 MG tablet Take 25 mg by mouth daily.   Cholecalciferol 50 MCG (2000 UT) TABS Take by mouth.   escitalopram  (LEXAPRO ) 20 MG tablet Take 1 tablet (20 mg total) by mouth daily.   Magnesium 250 MG TABS    methenamine  (HIPREX ) 1 g tablet Take 1 tablet (1 g total) by mouth 2 (two) times daily with a meal.   pantoprazole  (PROTONIX ) 40 MG tablet Take 40 mg by mouth daily.   rosuvastatin (CRESTOR) 5 MG tablet Take 5 mg by mouth daily.     Allergies:   Sulfa antibiotics   Social History   Socioeconomic History   Marital  status: Married    Spouse name: Not on file   Number of children: Not on file   Years of education: Not on file   Highest education level: Not on file  Occupational History   Not on file  Tobacco Use   Smoking status: Former    Types: Cigarettes   Smokeless tobacco: Never  Substance and Sexual Activity   Alcohol use: Not on file   Drug use: Not on file   Sexual activity: Not on file  Other Topics Concern   Not on file  Social History Narrative   Not on file   Social Drivers of Health   Financial Resource Strain: Not on file  Food Insecurity: No Food Insecurity (06/24/2024)   Hunger Vital Sign    Worried About Running Out of Food in the Last Year: Never true    Ran Out of Food in the Last Year: Never true  Transportation Needs: No Transportation Needs (06/24/2024)   PRAPARE - Administrator, Civil Service (Medical): No    Lack of Transportation (Non-Medical): No  Physical Activity: Not on file  Stress: Not on file  Social Connections: Moderately Isolated (06/24/2024)   Social Connection and Isolation Panel    Frequency of Communication with  Friends and Family: Once a week    Frequency of Social Gatherings with Friends and Family: Never    Attends Religious Services: More than 4 times per year    Active Member of [employer] or Organizations: No    Attends Banker Meetings: Never    Marital Status: Married     Family History: The patient's family history is negative for Breast cancer.  ROS:   Review of Systems  Constitution: Negative for decreased appetite, fever and weight gain.  HENT: Negative for congestion, ear discharge, hoarse voice and sore throat.   Eyes: Negative for discharge, redness, vision loss in right eye and visual halos.  Cardiovascular: Negative for chest pain, dyspnea on exertion, leg swelling, orthopnea and palpitations.  Respiratory: Negative for cough, hemoptysis, shortness of breath and snoring.   Endocrine: Negative for heat  intolerance and polyphagia.  Hematologic/Lymphatic: Negative for bleeding problem. Does not bruise/bleed easily.  Skin: Negative for flushing, nail changes, rash and suspicious lesions.  Musculoskeletal: Negative for arthritis, joint pain, muscle cramps, myalgias, neck pain and stiffness.  Gastrointestinal: Negative for abdominal pain, bowel incontinence, diarrhea and excessive appetite.  Genitourinary: Negative for decreased libido, genital sores and incomplete emptying.  Neurological: Negative for brief paralysis, focal weakness, headaches and loss of balance.  Psychiatric/Behavioral: Negative for altered mental status, depression and suicidal ideas.  Allergic/Immunologic: Negative for HIV exposure and persistent infections.    EKGs/Labs/Other Studies Reviewed:    The following studies were reviewed today:   EKG:  The ekg ordered today demonstrates   Recent Labs: 06/27/2024: ALT 118; BUN 18; Creatinine, Ser 0.76; Hemoglobin 12.8; Platelets 265; Potassium 3.7; Sodium 135 07/09/2024: TSH 2.65  Recent Lipid Panel    Component Value Date/Time   CHOL 134 04/29/2023 0929   TRIG 85 04/29/2023 0929   HDL 57 04/29/2023 0929   CHOLHDL 2.4 04/29/2023 0929   LDLCALC 61 04/29/2023 0929    Physical Exam:    VS:  BP 126/82   Pulse 80   Ht 5' 7 (1.702 m)   Wt 186 lb 3.2 oz (84.5 kg)   SpO2 92%   BMI 29.16 kg/m     Wt Readings from Last 3 Encounters:  08/31/24 186 lb 3.2 oz (84.5 kg)  07/25/24 176 lb (79.8 kg)  06/24/24 164 lb 0.4 oz (74.4 kg)     GEN: Well nourished, well developed in no acute distress HEENT: Normal NECK: No JVD; No carotid bruits LYMPHATICS: No lymphadenopathy CARDIAC: S1S2 noted,RRR, no murmurs, rubs, gallops RESPIRATORY:  Clear to auscultation without rales, wheezing or rhonchi  ABDOMEN: Soft, non-tender, non-distended, +bowel sounds, no guarding. EXTREMITIES: No edema, No cyanosis, no clubbing MUSCULOSKELETAL:  No deformity  SKIN: Warm and dry NEUROLOGIC:   Alert and oriented x 3, non-focal PSYCHIATRIC:  Normal affect, good insight  ASSESSMENT:    1. Elevated coronary artery calcium score   2. Essential hypertension    PLAN:    Coronary calcification-she is currently on aspirin 81 mg daily with recently been started on Crestor 5 mg daily.  Her recent lipid profile in May 2025 showed total cholesterol 138, HDL 51, LDL 67, triglyceride 107.\  Blood pressure is acceptable, continue with current antihypertensive regimen chlorthalidone  25 mg daily.  The patient is in agreement with the above plan. The patient left the office in stable condition.  The patient will follow up in   Medication Adjustments/Labs and Tests Ordered: Current medicines are reviewed at length with the patient today.  Concerns regarding medicines  are outlined above.  No orders of the defined types were placed in this encounter.  No orders of the defined types were placed in this encounter.   Patient Instructions  Medication Instructions:  Your physician recommends that you continue on your current medications as directed. Please refer to the Current Medication list given to you today.  *If you need a refill on your cardiac medications before your next appointment, please call your pharmacy*  Follow-Up: At [facility], you and your health needs are our priority.  As part of our continuing mission to provide you with exceptional heart care, our providers are all part of one team.  This team includes your primary Cardiologist (physician) and Advanced Practice Providers or APPs (Physician Assistants and Nurse Practitioners) who all work together to provide you with the care you need, when you need it.  Your next appointment:   1 year(s)  Provider:   Theoplis Garciagarcia, DO              Adopting a Healthy Lifestyle.  Know what a healthy weight is for you (roughly BMI <25) and aim to maintain this   Aim for 7+ servings of fruits and vegetables daily   65-80+  fluid ounces of water or unsweet tea for healthy kidneys   Limit to max 1 drink of alcohol per day; avoid smoking/tobacco   Limit animal fats in diet for cholesterol and heart health - choose grass fed whenever available   Avoid highly processed foods, and foods high in saturated/trans fats   Aim for low stress - take time to unwind and care for your mental health   Aim for 150 min of moderate intensity exercise weekly for heart health, and weights twice weekly for bone health   Aim for 7-9 hours of sleep daily   When it comes to diets, agreement about the perfect plan isnt easy to find, even among the experts. Experts at the [facility] [employer] developed an idea known as the Healthy Eating Plate. Just imagine a plate divided into logical, healthy portions.   The emphasis is on diet quality:   Load up on vegetables and fruits - one-half of your plate: Aim for color and variety, and remember that potatoes dont count.   Go for whole grains - one-quarter of your plate: Whole wheat, barley, wheat berries, quinoa, oats, brown rice, and foods made with them. If you want pasta, go with whole wheat pasta.   Protein power - one-quarter of your plate: Fish, chicken, beans, and nuts are all healthy, versatile protein sources. Limit red meat.   The diet, however, does go beyond the plate, offering a few other suggestions.   Use healthy plant oils, such as olive, canola, soy, corn, sunflower and peanut. Check the labels, and avoid partially hydrogenated oil, which have unhealthy trans fats.   If youre thirsty, drink water. Coffee and tea are good in moderation, but skip sugary drinks and limit milk and dairy products to one or two daily servings.   The type of carbohydrate in the diet is more important than the amount. Some sources of carbohydrates, such as vegetables, fruits, whole grains, and beans-are healthier than others.   Finally, stay active  Signed, Dub Huntsman, DO   09/02/2024 8:54 AM    [facility]

## 2024-09-05 ENCOUNTER — Ambulatory Visit: Admitting: Psychiatry

## 2024-09-05 DIAGNOSIS — F331 Major depressive disorder, recurrent, moderate: Secondary | ICD-10-CM | POA: Diagnosis not present
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
## 2024-09-15 ENCOUNTER — Other Ambulatory Visit: Payer: Self-pay | Admitting: Adult Health

## 2024-09-15 DIAGNOSIS — F331 Major depressive disorder, recurrent, moderate: Secondary | ICD-10-CM

## 2024-09-21 DIAGNOSIS — D225 Melanocytic nevi of trunk: Secondary | ICD-10-CM | POA: Diagnosis not present

## 2024-09-21 DIAGNOSIS — D485 Neoplasm of uncertain behavior of skin: Secondary | ICD-10-CM | POA: Diagnosis not present

## 2024-09-21 DIAGNOSIS — Z1283 Encounter for screening for malignant neoplasm of skin: Secondary | ICD-10-CM | POA: Diagnosis not present

## 2024-09-23 ENCOUNTER — Other Ambulatory Visit: Payer: Self-pay | Admitting: Adult Health

## 2024-09-23 DIAGNOSIS — F331 Major depressive disorder, recurrent, moderate: Secondary | ICD-10-CM

## 2024-09-23 DIAGNOSIS — F4310 Post-traumatic stress disorder, unspecified: Secondary | ICD-10-CM

## 2024-09-26 ENCOUNTER — Encounter

## 2024-09-28 ENCOUNTER — Ambulatory Visit: Admitting: Psychiatry

## 2024-09-28 ENCOUNTER — Telehealth: Payer: Self-pay

## 2024-09-28 ENCOUNTER — Ambulatory Visit: Admitting: Physician Assistant

## 2024-09-28 VITALS — BP 113/73 | HR 89 | Ht 66.0 in | Wt 177.0 lb

## 2024-09-28 DIAGNOSIS — Z87440 Personal history of urinary (tract) infections: Secondary | ICD-10-CM | POA: Diagnosis not present

## 2024-09-28 DIAGNOSIS — F331 Major depressive disorder, recurrent, moderate: Secondary | ICD-10-CM

## 2024-09-28 DIAGNOSIS — N390 Urinary tract infection, site not specified: Secondary | ICD-10-CM | POA: Diagnosis not present

## 2024-09-28 LAB — URINALYSIS, COMPLETE
Bilirubin, UA: NEGATIVE
Glucose, UA: NEGATIVE
Ketones, UA: NEGATIVE
Nitrite, UA: POSITIVE — AB
Protein,UA: NEGATIVE
Specific Gravity, UA: 1.01 (ref 1.005–1.030)
Urobilinogen, Ur: 0.2 mg/dL (ref 0.2–1.0)
pH, UA: 6 (ref 5.0–7.5)

## 2024-09-28 LAB — MICROSCOPIC EXAMINATION

## 2024-09-28 MED ORDER — CEFTRIAXONE SODIUM 1 G IJ SOLR
1.0000 g | Freq: Once | INTRAMUSCULAR | Status: AC
  Administered 2024-09-28: 1 g via INTRAMUSCULAR

## 2024-09-28 MED ORDER — CIPROFLOXACIN HCL 500 MG PO TABS: 500.0000 mg | ORAL_TABLET | Freq: Two times a day (BID) | ORAL | 0 refills | Status: AC

## 2024-09-28 NOTE — Progress Notes (Signed)
 09/28/2024 2:30 PM   Colleen Key Jun 04, 1956 [id number]  CC: Chief Complaint  Patient presents with   Follow-up   Recurrent UTI   HPI: Colleen Key is a 68 y.o. 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   Today she reports 3 days of dysuria and thick, cloudy urine.  She was febrile at symptom onset, Tmax 101 F, and had chills.  She has been taking Tylenol and is feeling much better today.  She remains on Hiprex  and thinks it is helping significantly with UTI prevention.  In-office catheterized UA today positive for trace intact blood, nitrites, and 2+ leukocytes; urine microscopy with 11-30 WBCs/HPF and moderate bacteria.   PMH: Past Medical History:  Diagnosis Date   Bladder cancer [facility]) 1998    Surgical History: No past surgical history on file.  Home Medications:  Allergies as of 09/28/2024       Reactions   Sulfa Antibiotics Swelling        Medication List        Accurate as of September 28, 2024  2:30 PM. If you have any questions, ask your nurse or doctor.          ALPRAZolam  0.5 MG tablet Commonly known as: Xanax  Take one tablet at bedtime as needed for sleep.   ARIPiprazole  2 MG tablet Commonly known as: ABILIFY  TAKE 1 TABLET BY MOUTH DAILY   aspirin EC 81 MG tablet Take 81 mg by mouth daily. Swallow whole.   buPROPion  150 MG 24 hr tablet Commonly known as: WELLBUTRIN  XL Take 1 tablet (150 mg total) by mouth daily.   cetirizine 10 MG tablet Commonly known as: ZYRTEC   chlorthalidone  25 MG tablet Commonly known as: HYGROTON  Take 25 mg by mouth daily.   Cholecalciferol 50 MCG (2000 UT) Tabs Take by mouth.   escitalopram  20 MG tablet Commonly known as: LEXAPRO  TAKE 1 TABLET BY MOUTH DAILY   Magnesium 250 MG Tabs   methenamine  1 g tablet Commonly known as: HIPREX  Take 1  tablet (1 g total) by mouth 2 (two) times daily with a meal.   pantoprazole  40 MG tablet Commonly known as: PROTONIX  Take 40 mg by mouth daily.   rosuvastatin 5 MG tablet Commonly known as: CRESTOR Take 5 mg by mouth daily.        Allergies:  Allergies  Allergen Reactions   Sulfa Antibiotics Swelling    Family History: Family History  Problem Relation Age of Onset   Breast cancer Neg Hx     Social History:   reports that she has quit smoking. Her smoking use included cigarettes. She has never used smokeless tobacco. No history on file for alcohol use and drug use.  Physical Exam: BP 113/73 (BP Location: Left Arm, Patient Position: Sitting, Cuff Size: Large)   Pulse 89   Ht 5' 6 (1.676 m)   Wt 177 lb (80.3 kg)   SpO2 95%   BMI 28.57 kg/m   Constitutional:  Alert and oriented, no acute distress, nontoxic appearing HEENT: [place], AT Cardiovascular: No clubbing, cyanosis, or edema Respiratory: Normal respiratory effort, no increased work of breathing Skin: No rashes, bruises or suspicious lesions Neurologic: Grossly intact, no focal deficits, moving all 4 extremities Psychiatric: Normal mood and affect  Laboratory Data: Results for orders placed or performed in visit on 09/28/24  Microscopic Examination  Collection Time: 09/28/24  2:03 PM   Urine  Result Value Ref Range   WBC, UA 11-30 (A) 0 - 5 /hpf   RBC, Urine 0-2 0 - 2 /hpf   Epithelial Cells (non renal) 0-10 0 - 10 /hpf   Bacteria, UA Moderate (A) None seen/Few  Urinalysis, Complete   Collection Time: 09/28/24  2:03 PM  Result Value Ref Range   Specific Gravity, UA 1.010 1.005 - 1.030   pH, UA 6.0 5.0 - 7.5   Color, UA Yellow Yellow   Appearance Ur Slightly cloudy Clear   Leukocytes,UA 2+ (A) Negative   Protein,UA Negative Negative/Trace   Glucose, UA Negative Negative   Ketones, UA Negative Negative   RBC, UA Trace (A) Negative   Bilirubin, UA Negative Negative   Urobilinogen, Ur 0.2 0.2 - 1.0 mg/dL    Nitrite, UA Positive (A) Negative   Microscopic Examination See below:    Assessment & Plan:   1. Recurrent UTI (Primary) Febrile UTI, though vitals are stable in clinic today.  UA positive, though we both understand that her UA will always be positive in the setting of urinary diversion.  Will treat with IM Rocephin  and transition to p.o. Cipro and send for culture for further evaluation.  We discussed that if her symptoms worsen, she should go to the emergency room for IV therapy. - Urinalysis, Complete - CULTURE, URINE COMPREHENSIVE - cefTRIAXone  (ROCEPHIN ) injection 1 g - ciprofloxacin (CIPRO) 500 MG tablet; Take 1 tablet (500 mg total) by mouth 2 (two) times daily for 7 days.  Dispense: 14 tablet; Refill: 0   Return if symptoms worsen or fail to improve.  Lucie Hones, PA-C  [facility] [place] [address], [address] [place], [place] [address] [phone]

## 2024-09-28 NOTE — Telephone Encounter (Signed)
 Pt called over night triage line and I answered her call first thing this morning. Pt has c/o burning with urination, cloudy and smelly urine and she states she was running a low grade fever a couple days up to around 100.1. Pt has recent Hx of UTIs. I scheduled her for a same day appt today. Pt is aware we will probably need to in and out cath her at her visit as she routinely self caths herself.
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
## 2024-10-01 LAB — CULTURE, URINE COMPREHENSIVE

## 2024-10-09 ENCOUNTER — Encounter: Payer: Self-pay | Admitting: Radiology

## 2024-10-19 ENCOUNTER — Ambulatory Visit: Admitting: Psychiatry

## 2024-10-19 DIAGNOSIS — F331 Major depressive disorder, recurrent, moderate: Secondary | ICD-10-CM | POA: Diagnosis not present
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
## 2024-10-23 ENCOUNTER — Other Ambulatory Visit: Payer: Self-pay

## 2024-10-23 ENCOUNTER — Other Ambulatory Visit: Payer: Self-pay | Admitting: Physician Assistant

## 2024-10-23 DIAGNOSIS — N390 Urinary tract infection, site not specified: Secondary | ICD-10-CM

## 2024-10-23 MED ORDER — METHENAMINE HIPPURATE 1 G PO TABS
1.0000 g | ORAL_TABLET | Freq: Two times a day (BID) | ORAL | 11 refills | Status: AC
Start: 2024-10-23 — End: ?

## 2024-10-25 MED ORDER — CHLORTHALIDONE 25 MG PO TABS
25.0000 mg | ORAL_TABLET | Freq: Every day | ORAL | 3 refills | Status: AC
Start: 2024-10-25 — End: ?

## 2024-10-26 ENCOUNTER — Ambulatory Visit
Admission: RE | Admit: 2024-10-26 | Discharge: 2024-10-26 | Disposition: A | Discharge status: Home / Self Care | Source: Ambulatory Visit | Attending: Registered Nurse | Admitting: Registered Nurse

## 2024-10-26 DIAGNOSIS — Z1231 Encounter for screening mammogram for malignant neoplasm of breast: Secondary | ICD-10-CM | POA: Insufficient documentation

## 2024-10-31 ENCOUNTER — Telehealth (INDEPENDENT_AMBULATORY_CARE_PROVIDER_SITE_OTHER): Admitting: Adult Health

## 2024-10-31 ENCOUNTER — Encounter: Payer: Self-pay | Admitting: Adult Health

## 2024-10-31 DIAGNOSIS — F4310 Post-traumatic stress disorder, unspecified: Secondary | ICD-10-CM | POA: Diagnosis not present

## 2024-10-31 DIAGNOSIS — F331 Major depressive disorder, recurrent, moderate: Secondary | ICD-10-CM | POA: Diagnosis not present

## 2024-10-31 DIAGNOSIS — F410 Panic disorder [episodic paroxysmal anxiety] without agoraphobia: Secondary | ICD-10-CM

## 2024-10-31 DIAGNOSIS — G4700 Insomnia, unspecified: Secondary | ICD-10-CM

## 2024-10-31 MED ORDER — ALPRAZOLAM 0.5 MG PO TABS: ORAL_TABLET | ORAL | 2 refills | Status: AC
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
## 2024-11-16 ENCOUNTER — Ambulatory Visit: Admitting: Psychiatry

## 2024-11-16 DIAGNOSIS — F331 Major depressive disorder, recurrent, moderate: Secondary | ICD-10-CM | POA: Diagnosis not present
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
## 2024-11-17 LAB — LAB REPORT - SCANNED
A1c: 5.7
EGFR: 68

## 2024-11-22 ENCOUNTER — Encounter: Payer: Self-pay | Admitting: Cardiology

## 2024-11-27 ENCOUNTER — Telehealth: Payer: Self-pay | Admitting: Cardiology

## 2024-11-27 NOTE — Telephone Encounter (Signed)
 Pt c/o BP issue: STAT if pt c/o blurred vision, one-sided weakness or slurred speech.  STAT if BP is GREATER than 180/120 TODAY.  STAT if BP is LESS than 90/60 and SYMPTOMATIC TODAY  1. What is your BP concern?   See below  2. Have you taken any BP medication today?  Yes  3. What are your last 5 BP readings?  120/106 - 12/21 while having dinner 126/76 - last night 98/57 - last night  4. Are you having any other symptoms (ex. Dizziness, headache, blurred vision, passed out)?   No   Patient stated last night during dinner she had an episode of hot flash then felt weak but with no pain, then felt shaky/weak.  Patient stated the episode last about 20 minutes and afterward she felt cold as she was very sweaty.

## 2024-11-27 NOTE — Telephone Encounter (Signed)
 Pt states she had a huge hot flash while eating dinner, then was pouring sweat last night. 120/106 at the time. Pt states her BS 136. Lasted for about 15-20 minutes and resolved. Pt felt like she was on fire. No other symptoms noted. Hx anxiety. 126/76 and 98/57 prior to going to bed.  Advised patient to stay hydrated. Pt stated she had eaten several times prior that day also. Will send to Dr. Sheena for any further recommendations.  Pt verbalized understanding.

## 2024-11-27 NOTE — Telephone Encounter (Signed)
 Left message for patient to call back. ?postprandial hypotension  Will await call back for further information   Isolated event?

## 2024-11-27 NOTE — Telephone Encounter (Signed)
 Patient returned call

## 2024-12-05 NOTE — Telephone Encounter (Signed)
 Called pt, left detailed message for on the machine per DPR.

## 2024-12-14 ENCOUNTER — Ambulatory Visit: Admitting: Psychiatry

## 2024-12-14 DIAGNOSIS — F331 Major depressive disorder, recurrent, moderate: Secondary | ICD-10-CM

## 2024-12-14 NOTE — Progress Notes (Signed)
 "       [facility] Counselor/Therapist Progress Note  Patient ID: Colleen Key, MRN: [id number],    Date: 12/14/2024  Time Spent: 55 minutes   Treatment Type: Individual Therapy  Reported Symptoms: Some anxiety and depression, trying to let go of things in the past to move forward, relationship issues with husband who refuses therapy or to work on issues between the 2 of them,    Mental Status Exam:  Appearance:   Casual     Behavior:  Appropriate, Sharing, and Motivated  Motor:  Normal  Speech/Language:   Clear and Coherent  Affect:  Some depression and anxiety.  Mood:  anxious and depressed  Thought process:  goal directed  Thought content:    WNL  Sensory/Perceptual disturbances:    WNL  Orientation:  oriented to person, place, time/date, situation, day of week, month of year, year, and stated date of Jan. 8, 2026  Attention:  Good  Concentration:  Good  Memory:  WNL  Fund of knowledge:   Good  Insight:    Good  Judgment:   Good  Impulse Control:  Good   Risk Assessment: Danger to Self:  No Self-injurious Behavior: No Danger to Others: No Duty to Warn:no Physical Aggression / Violence:No  Access to Firearms a concern: No  Gang Involvement:No   Subjective:    Patient in for session today and reporting that she continues to work on her treatment goals, is paying attention to herself mentally/emotionally/and physically as she tries to encourage herself and works in therapy and outside of therapy for her goals.  Self-care is definitely continuing to improve. But currently having some health issues that are concerning and she needed to share today and have support. Is to follow up with her Dr soon to get on a ointment and be seen.  (Not all details included in this note due to patient privacy needs).  Holiday time went okay at her home.  Some family challenges going on currently.  Continues to work on her anxiety, relationship concerns, letting go of past hurts, depression, and  working to move forward in meaningful ways that are healthy for her.  Commits to working on another issue that was mentioned near end of session and we will follow-up on at next visit.  Interventions: Cognitive Behavioral Therapy, Solution-Oriented/Positive Psychology, and Ego-Supportive Elevate mood and show evidence of usual energy, activities, and socialization level.  2.  Verbalize an understanding of the relationship between repressed anger and depressed mood. 3.  Encourage sharing feelings of depression, and hurtful incidents in the past, in order to clarify them and gain insight to her depression.     Diagnosis:   ICD-10-CM   1. Major depressive disorder, recurrent episode, moderate (HCC)  F33.1      Plan:    Patient in session today and working further on her part of a difficult marriage situation which feeds her depression, anxiety, and hurt.  (All details not included in this note due to patient privacy needs).  Even with all of the marital relationship challenges, patient has shown some resilience in her attitude, behavior, and her own self-esteem.  She continues to talk and work through her anxiety, hurt, and depression.  Has been diligent in making and keeping some good friendships outside of the home particularly within her church.  Understandably is dealing with some frustration, and anger but not getting stuck in these.  Is concerned with a health issue and plans to contact her doctor about some  lab work and have some of her questions answered.  Continued challenges with husband at times at home and he refuses counseling.  Patient focusing more on how to take care of her own needs and be moving forward, as she works to let go of old hurts.  Goal review and progress/challenges noted with patient.  Next appointment within 2 to 3 weeks.   Barnie Bunde, LCSW                   "

## 2024-12-18 ENCOUNTER — Telehealth: Payer: Self-pay | Admitting: Adult Health

## 2024-12-18 NOTE — Telephone Encounter (Signed)
 LVM to [facility]

## 2024-12-18 NOTE — Telephone Encounter (Signed)
 Patient lvm at 10:27 stating that she needs a rtc she has some questions regarding her medication. PH: [phone] Appt 2/24

## 2024-12-19 NOTE — Telephone Encounter (Signed)
 Pt LVM that she was returning my call from yesterday. I called her back and got VM, left msg.

## 2024-12-20 NOTE — Telephone Encounter (Signed)
 Reviewed recommendations with patient.

## 2024-12-20 NOTE — Telephone Encounter (Signed)
 Left second VM to [facility]

## 2024-12-20 NOTE — Telephone Encounter (Signed)
 Pt asking to wean off Abilify . She is only taking 2 mg. When I asked her why she wanted to stop it, she said she had been stumbling, falling, and was nauseated after taking it in the morning. She reports being septic from UTI in July and questions if that could be affecting her still. She said her doctor told her she was on such a low dose she didn't think there was anything to worry about. When asked if she was seeing any benefit she reported no better than Wellbutrin  or Lexapro . 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 She said if you want her to stay on it she will. Last seen 11/25, FU 2/24.

## 2024-12-22 ENCOUNTER — Ambulatory Visit: Payer: Self-pay | Admitting: Urology

## 2025-01-09 ENCOUNTER — Ambulatory Visit: Admitting: Urology

## 2025-01-11 ENCOUNTER — Ambulatory Visit (INDEPENDENT_AMBULATORY_CARE_PROVIDER_SITE_OTHER): Admitting: Psychiatry

## 2025-01-11 DIAGNOSIS — F331 Major depressive disorder, recurrent, moderate: Secondary | ICD-10-CM

## 2025-01-11 NOTE — Progress Notes (Signed)
"   °      [facility] Counselor/Therapist Progress Note  Patient ID: Colleen Key, MRN: [id number],    Date: 01/11/2025  Time Spent: 555 minutes   Treatment Type: Individual Therapy  Reported Symptoms:  depressed, anxious  Mental Status Exam:  Appearance:   Casual, Neat, and Well Groomed     Behavior:  Appropriate, Sharing, and Motivated  Motor:  Normal  Speech/Language:   Clear and Coherent  Affect:  Depressed and anxious  Mood:  anxious and depressed  Thought process:  goal directed  Thought content:    WNL  Sensory/Perceptual disturbances:    WNL  Orientation:  oriented to person, place, time/date, situation, day of week, month of year, year, and stated date of Feb. 5, 2026  Attention:  Fair  Concentration:  Good and Fair  Memory:  WNL  Fund of knowledge:   Good  Insight:    Good  Judgment:   Good  Impulse Control:  Fair   Risk Assessment: Danger to Self:  No Self-injurious Behavior: No Danger to Others: No Duty to Warn:no Physical Aggression / Violence:No  Access to Firearms a concern: No  Gang Involvement:No   Subjective:  Patient reporting she has been sick some since my last appt and got needed meds change and am feeling much better. Also finding her meds through this office are helping her more emotionally. More energy now, doing better at work. Husband more quiet recently, used to be in a rage often but now is somewhat leveled out. Focusing on some specific individual and marital challenges, along with taking better care of herself physically, mentally and emotionally.  Still struggling with some depression but taking better care of herself, being sure to have time with friends and others who are supportive of her, and staying involved in activities as she is able especially at her church.  Working further on her anxiety and depression, and is having some success which is noticeable to patient and others.   Interventions: Cognitive Behavioral Therapy,  Solution-Oriented/Positive Psychology, and Ego-Supportive 1.  Elevate mood and show evidence of usual energy, activities, and socialization level. 2.  Verbalize an understanding of the relationship between repressed anger and depressed mood. 3.  Encouraged sharing of feelings of depression, and hurtful incidents in the past in order to clarify them and gain insight to her depression so as to help alleviate that depression.   Diagnosis:   ICD-10-CM   1. Major depressive disorder, recurrent episode, moderate (HCC)  F33.1      Plan:    Patient today working further on her depression and anxiety.  Showing good motivation and showing more self-care/self-love.  Recognizing more the connection between her depressed mood at times and repressed anger or resentment and continues to work on this in a more positive way, inside and outside of sessions.  Anger has decreased some overall and her commitment to continue working on this is good.  Continued involvement and social activities with some of her friends and also church family.  Trying to set limits with daughter-in-law to where she can no longer keep taking advantage of patient.  (Not all details included in this note due to patient privacy needs.)  Goal review and progress/challenges noted with patient.   Next appt within 3/4 weeks.   Barnie Bunde, LCSW                   "

## 2025-01-30 ENCOUNTER — Telehealth: Admitting: Adult Health

## 2025-02-13 ENCOUNTER — Ambulatory Visit: Admitting: Psychiatry

## 2025-02-14 ENCOUNTER — Ambulatory Visit: Admitting: Psychiatry

## 2025-03-13 ENCOUNTER — Ambulatory Visit: Admitting: Physician Assistant

## 2025-03-15 ENCOUNTER — Ambulatory Visit: Admitting: Psychiatry
